# Patient Record
Sex: Male | Born: 1945 | Race: White | Hispanic: No | Smoking: Former smoker
Health system: Southern US, Community
[De-identification: ages and names within clinical notes are randomized; demographics above are authoritative.]

## PROBLEM LIST (undated history)

## (undated) DIAGNOSIS — I714 Abdominal aortic aneurysm, without rupture, unspecified: Secondary | ICD-10-CM

## (undated) DIAGNOSIS — E785 Hyperlipidemia, unspecified: Secondary | ICD-10-CM

## (undated) DIAGNOSIS — I25119 Atherosclerotic heart disease of native coronary artery with unspecified angina pectoris: Secondary | ICD-10-CM

## (undated) DIAGNOSIS — I252 Old myocardial infarction: Secondary | ICD-10-CM

## (undated) DIAGNOSIS — E119 Type 2 diabetes mellitus without complications: Secondary | ICD-10-CM

## (undated) DIAGNOSIS — I10 Essential (primary) hypertension: Secondary | ICD-10-CM

## (undated) DIAGNOSIS — M25552 Pain in left hip: Secondary | ICD-10-CM

## (undated) DIAGNOSIS — Z9289 Personal history of other medical treatment: Secondary | ICD-10-CM

## (undated) DIAGNOSIS — R079 Chest pain, unspecified: Secondary | ICD-10-CM

## (undated) HISTORY — DX: Pain in left hip: M25.552

## (undated) HISTORY — DX: Abdominal aortic aneurysm, without rupture: I71.4

## (undated) HISTORY — PX: SHOULDER SURGERY: SHX246

## (undated) HISTORY — DX: Hyperlipidemia, unspecified: E78.5

## (undated) HISTORY — DX: Type 2 diabetes mellitus without complications: E11.9

## (undated) HISTORY — DX: Chest pain, unspecified: R07.9

## (undated) HISTORY — DX: Abdominal aortic aneurysm, without rupture, unspecified: I71.40

## (undated) HISTORY — DX: Personal history of other medical treatment: Z92.89

---
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
## 1997-10-28 ENCOUNTER — Encounter: Admission: RE | Admit: 1997-10-28 | Discharge: 1998-01-26 | Payer: Self-pay | Admitting: *Deleted

## 1999-09-21 ENCOUNTER — Ambulatory Visit (HOSPITAL_COMMUNITY): Admission: RE | Admit: 1999-09-21 | Discharge: 1999-09-21 | Payer: Self-pay | Admitting: *Deleted

## 2001-06-22 ENCOUNTER — Encounter: Payer: Self-pay | Admitting: Emergency Medicine

## 2001-06-22 ENCOUNTER — Emergency Department (HOSPITAL_COMMUNITY): Admission: EM | Admit: 2001-06-22 | Discharge: 2001-06-22 | Payer: Self-pay | Admitting: Emergency Medicine

## 2002-10-27 ENCOUNTER — Ambulatory Visit (HOSPITAL_COMMUNITY): Admission: RE | Admit: 2002-10-27 | Discharge: 2002-10-27 | Payer: Self-pay | Admitting: Internal Medicine

## 2004-01-11 ENCOUNTER — Emergency Department (HOSPITAL_COMMUNITY): Admission: EM | Admit: 2004-01-11 | Discharge: 2004-01-11 | Payer: Self-pay | Admitting: Emergency Medicine

## 2005-02-06 ENCOUNTER — Ambulatory Visit (HOSPITAL_COMMUNITY): Admission: RE | Admit: 2005-02-06 | Discharge: 2005-02-06 | Payer: Self-pay | Admitting: *Deleted

## 2005-12-19 ENCOUNTER — Emergency Department (HOSPITAL_COMMUNITY): Admission: EM | Admit: 2005-12-19 | Discharge: 2005-12-19 | Payer: Self-pay | Admitting: Emergency Medicine

## 2005-12-20 ENCOUNTER — Emergency Department (HOSPITAL_COMMUNITY): Admission: EM | Admit: 2005-12-20 | Discharge: 2005-12-20 | Payer: Self-pay | Admitting: Emergency Medicine

## 2007-02-23 ENCOUNTER — Encounter: Admission: RE | Admit: 2007-02-23 | Discharge: 2007-02-23 | Payer: Self-pay | Admitting: Orthopedic Surgery

## 2010-12-15 NOTE — Op Note (Signed)
[place]  Patient:    John Shaffer, BURGETT                    MRN: [id number] Proc. Date: 09/21/99 Adm. Date:  [id number] Attending:  Sabino Gasser                           Operative Report  PROCEDURE:  Colonoscopy.  ENDOSCOPIST:  Sabino Gasser, M.D.  INDICATIONS:  Mr. Buch is a 65 year old gentleman referred by Dr. Soyla Murphy. Pharr for evaluation of Hemoccult positivity.  ANESTHESIA:  Demerol 100 mg, Versed 10 mg given intravenously in divided dose.  DESCRIPTION OF PROCEDURE:  With the patient mildly sedated in the left lateral decubitus position, the Olympus videoscopic colonoscope was inserted into the rectum after a normal rectal examination was performed, and passed under direct  vision to the cecum.  The cecum was identified by the ileocecal valve and appendiceal orifice, both of which were photographed.  From this point the colonoscope was slowly withdrawn, taking circumferential views of the entire colonic mucosa after clearing the cecum of fecal debris.  This was done until the colonoscope was removed all the way to the rectum, which appeared normal on direct view, and showed fairly large internal hemorrhoids on retroflexed view.  The endoscope was straightened and withdrawn.  The patients vital signs were pulse oximeter remained stable.  The patient tolerated the procedure well without apparent complications.  FINDINGS:  Internal hemorrhoids.  This may very well explain the patients blood  per rectum.  PLAN:  Will have the patient follow up with me on an as-needed basis, and in five years for a cancer screening. DD:  09/21/99 TD:  09/21/99 Job: [id number]

## 2012-07-18 ENCOUNTER — Other Ambulatory Visit (HOSPITAL_COMMUNITY): Payer: Self-pay | Admitting: Cardiovascular Disease

## 2012-07-18 DIAGNOSIS — R079 Chest pain, unspecified: Secondary | ICD-10-CM

## 2012-08-04 ENCOUNTER — Ambulatory Visit (HOSPITAL_COMMUNITY)
Admission: RE | Admit: 2012-08-04 | Discharge: 2012-08-04 | Disposition: A | Discharge status: Home / Self Care | Payer: 59 | Source: Ambulatory Visit | Attending: Cardiovascular Disease | Admitting: Cardiovascular Disease

## 2012-08-04 DIAGNOSIS — I059 Rheumatic mitral valve disease, unspecified: Secondary | ICD-10-CM | POA: Insufficient documentation

## 2012-08-04 DIAGNOSIS — R079 Chest pain, unspecified: Secondary | ICD-10-CM | POA: Insufficient documentation

## 2012-08-04 DIAGNOSIS — E119 Type 2 diabetes mellitus without complications: Secondary | ICD-10-CM | POA: Insufficient documentation

## 2012-08-04 DIAGNOSIS — I379 Nonrheumatic pulmonary valve disorder, unspecified: Secondary | ICD-10-CM | POA: Insufficient documentation

## 2012-08-04 NOTE — Progress Notes (Signed)
2D Echo Performed 08/04/2012    Trek Kimball, RCS  

## 2013-02-01 ENCOUNTER — Emergency Department (HOSPITAL_COMMUNITY)
Admission: EM | Admit: 2013-02-01 | Discharge: 2013-02-01 | Disposition: A | Discharge status: Home / Self Care | Payer: 59 | Attending: Emergency Medicine | Admitting: Emergency Medicine

## 2013-02-01 ENCOUNTER — Encounter (HOSPITAL_COMMUNITY): Payer: Self-pay

## 2013-02-01 DIAGNOSIS — Z79899 Other long term (current) drug therapy: Secondary | ICD-10-CM | POA: Insufficient documentation

## 2013-02-01 DIAGNOSIS — R51 Headache: Secondary | ICD-10-CM | POA: Insufficient documentation

## 2013-02-01 DIAGNOSIS — Y939 Activity, unspecified: Secondary | ICD-10-CM | POA: Insufficient documentation

## 2013-02-01 DIAGNOSIS — S90569A Insect bite (nonvenomous), unspecified ankle, initial encounter: Secondary | ICD-10-CM | POA: Insufficient documentation

## 2013-02-01 DIAGNOSIS — R21 Rash and other nonspecific skin eruption: Secondary | ICD-10-CM | POA: Insufficient documentation

## 2013-02-01 DIAGNOSIS — Y929 Unspecified place or not applicable: Secondary | ICD-10-CM | POA: Insufficient documentation

## 2013-02-01 DIAGNOSIS — Z87891 Personal history of nicotine dependence: Secondary | ICD-10-CM | POA: Insufficient documentation

## 2013-02-01 DIAGNOSIS — W57XXXA Bitten or stung by nonvenomous insect and other nonvenomous arthropods, initial encounter: Secondary | ICD-10-CM

## 2013-02-01 MED ORDER — DOXYCYCLINE HYCLATE 100 MG PO CAPS: 100.0000 mg | ORAL_CAPSULE | Freq: Two times a day (BID) | ORAL | Status: DC

## 2013-02-01 NOTE — ED Provider Notes (Signed)
History  This chart was scribed for  by Ladona Ridgel Day, ED scribe. This patient was seen in room WTR6/WTR6 and the patient's care was started at 1603.  CSN: [id number] Arrival date & time 02/01/13  1528  First MD Initiated Contact with Patient 02/01/13 1603     Chief Complaint  Patient presents with  . Insect Bite    patient removed a tick 4 days ago   Patient is a 67 y.o. male presenting with rash. The history is provided by the patient. No language interpreter was used.  Rash Pain location: LLE, lateral thigh. Pain quality: aching   Pain radiates to:  Does not radiate Pain severity:  Mild Onset quality:  Gradual Duration:  4 days Timing:  Constant Progression:  Worsening Chronicity:  New Relieved by:  Nothing Worsened by:  Nothing tried Ineffective treatments:  None tried Associated symptoms: chills   Associated symptoms: no chest pain, no cough, no fever, no nausea, no shortness of breath and no vomiting    HPI Comments: John Shaffer is a 67 y.o. male who presents to the Emergency Department complaining of tick bite removed from his left lateral thigh 4 days ago which has been constant, gradually worsening in tenderness, redness, and swelling around where the tick was removed. He states he feels like he has some flu symptoms. He denies discharge or drainage from area of his skin. He states has a moderate HA, feels achy and his left leg is sore. He states hydrating well and eating/drinking normally.   PCP Dr. Juleen China  History reviewed. No pertinent past medical history. Past Surgical History  Procedure Laterality Date  . Shoulder surgery     History reviewed. No pertinent family history. History  Substance Use Topics  . Smoking status: Former Games developer  . Smokeless tobacco: Never Used  . Alcohol Use: No    Review of Systems  Constitutional: Positive for chills. Negative for fever.  HENT: Negative for congestion and rhinorrhea.   Respiratory: Negative for cough, choking  and shortness of breath.   Cardiovascular: Negative for chest pain.  Gastrointestinal: Negative for nausea, vomiting and abdominal pain.  Musculoskeletal: Negative for back pain.  Skin: Positive for rash and wound (tick bite, swelling and redness from 4 days ago).  Neurological: Negative for syncope and weakness.  All other systems reviewed and are negative.   A complete 10 system review of systems was obtained and all systems are negative except as noted in the HPI and PMH.   Allergies  Review of patient's allergies indicates no known allergies.  Home Medications   Current Outpatient Rx  Name  Route  Sig  Dispense  Refill  . Multiple Vitamin (MULTIVITAMIN WITH MINERALS) TABS   Oral   Take 1 tablet by mouth daily.         . rosuvastatin (CRESTOR) 20 MG tablet   Oral   Take 20 mg by mouth at bedtime.          Triage Vitals: BP 137/74  Pulse 96  Temp(Src) 99.1 F (37.3 C) (Oral)  Resp 16  Ht 5' 10.5" (1.791 m)  Wt 202 lb 2 oz (91.683 kg)  BMI 28.58 kg/m2  SpO2 97% Physical Exam  Nursing note and vitals reviewed. Constitutional: He is oriented to person, place, and time. He appears well-developed and well-nourished. No distress.  HENT:  Head: Normocephalic and atraumatic.  Eyes: EOM are normal.  Neck: Neck supple. No tracheal deviation present.  Cardiovascular: Normal rate.   Pulmonary/Chest:  Effort normal. No respiratory distress.  Musculoskeletal: Normal range of motion.  Neurological: He is alert and oriented to person, place, and time.  Skin: Skin is warm and dry.     Psychiatric: He has a normal mood and affect. His behavior is normal.    ED Course  Procedures (including critical care time) DIAGNOSTIC STUDIES: Oxygen Saturation is 97% on room air, normal by my interpretation.    COORDINATION OF CARE: At 410 PM Discussed treatment plan with patient which includes doxycycline for 21 days. Will see PCP Dr. Juleen China on the 18th of this this month. Will try to  call and see if he can get in sooner. Patient agrees.   Labs Reviewed - No data to display Dx: Tick Bite  MDM   66 y.o.John Shaffer's evaluation in the Emergency Department is complete. It has been determined that no acute conditions requiring further emergency intervention are present at this time. The patient/guardian have been advised of the diagnosis and plan. We have discussed signs and symptoms that warrant return to the ED, such as changes or worsening in symptoms.  Vital signs are stable at discharge. Filed Vitals:   02/01/13 1544  BP: 137/74  Pulse: 96  Temp: 99.1 F (37.3 C)  Resp: 16    Patient/guardian has voiced understanding and agreed to follow-up with the PCP or specialist.  I personally performed the services described in this documentation, which was scribed in my presence. The recorded information has been reviewed and is accurate.    Dorthula Matas, PA-C 02/01/13 1623

## 2013-02-01 NOTE — ED Provider Notes (Signed)
Medical screening examination/treatment/procedure(s) were performed by non-physician practitioner and as supervising physician I was immediately available for consultation/collaboration.  Ethelda Chick, MD 02/01/13 1630

## 2013-02-01 NOTE — ED Notes (Signed)
Patient states that he removed a tick from left outer thigh. Area is red and sore. Patient reports tht he feels like he has the flu.

## 2013-02-01 NOTE — ED Notes (Signed)
Lab tech at bedside for blood draw.

## 2013-02-01 NOTE — ED Notes (Signed)
Pt ambulatory to exam room with steady gait. Pt states he has a headache and had a fever. Pt a/o x 4 with no acute distress.

## 2013-02-02 LAB — B. BURGDORFI ANTIBODIES: B burgdorferi Ab IgG+IgM: 0.35 {ISR}

## 2015-02-15 ENCOUNTER — Encounter: Payer: Self-pay | Admitting: *Deleted

## 2015-03-28 ENCOUNTER — Encounter: Payer: Self-pay | Admitting: Cardiovascular Disease

## 2015-03-30 ENCOUNTER — Encounter: Payer: Self-pay | Admitting: Cardiovascular Disease

## 2017-01-27 ENCOUNTER — Encounter (HOSPITAL_COMMUNITY): Payer: Self-pay | Admitting: Emergency Medicine

## 2017-01-27 ENCOUNTER — Emergency Department (HOSPITAL_COMMUNITY)
Admission: EM | Admit: 2017-01-27 | Discharge: 2017-01-27 | Disposition: A | Discharge status: Home / Self Care | Payer: Medicare Other | Attending: Emergency Medicine | Admitting: Emergency Medicine

## 2017-01-27 DIAGNOSIS — T782XXA Anaphylactic shock, unspecified, initial encounter: Secondary | ICD-10-CM | POA: Diagnosis not present

## 2017-01-27 DIAGNOSIS — Y999 Unspecified external cause status: Secondary | ICD-10-CM | POA: Diagnosis not present

## 2017-01-27 DIAGNOSIS — T63461A Toxic effect of venom of wasps, accidental (unintentional), initial encounter: Secondary | ICD-10-CM | POA: Diagnosis not present

## 2017-01-27 DIAGNOSIS — Y929 Unspecified place or not applicable: Secondary | ICD-10-CM | POA: Diagnosis not present

## 2017-01-27 DIAGNOSIS — R600 Localized edema: Secondary | ICD-10-CM | POA: Insufficient documentation

## 2017-01-27 DIAGNOSIS — Z87891 Personal history of nicotine dependence: Secondary | ICD-10-CM | POA: Diagnosis not present

## 2017-01-27 DIAGNOSIS — R0602 Shortness of breath: Secondary | ICD-10-CM | POA: Diagnosis present

## 2017-01-27 DIAGNOSIS — Y9389 Activity, other specified: Secondary | ICD-10-CM | POA: Insufficient documentation

## 2017-01-27 DIAGNOSIS — Z79899 Other long term (current) drug therapy: Secondary | ICD-10-CM | POA: Diagnosis not present

## 2017-01-27 DIAGNOSIS — W57XXXA Bitten or stung by nonvenomous insect and other nonvenomous arthropods, initial encounter: Secondary | ICD-10-CM | POA: Insufficient documentation

## 2017-01-27 MED ORDER — DIPHENHYDRAMINE HCL 25 MG PO TABS: 25.0000 mg | ORAL_TABLET | Freq: Three times a day (TID) | ORAL | 0 refills | Status: DC

## 2017-01-27 MED ORDER — METHYLPREDNISOLONE SODIUM SUCC 125 MG IJ SOLR
125.0000 mg | Freq: Once | INTRAMUSCULAR | Status: AC
  Administered 2017-01-27: 125 mg via INTRAVENOUS
  Filled 2017-01-27: qty 2

## 2017-01-27 MED ORDER — FAMOTIDINE 20 MG PO TABS: 20.0000 mg | ORAL_TABLET | Freq: Two times a day (BID) | ORAL | 0 refills | Status: DC

## 2017-01-27 MED ORDER — EPINEPHRINE 0.3 MG/0.3ML IJ SOAJ: 0.3000 mg | Freq: Once | INTRAMUSCULAR | 1 refills | Status: AC

## 2017-01-27 MED ORDER — FAMOTIDINE IN NACL 20-0.9 MG/50ML-% IV SOLN
20.0000 mg | Freq: Once | INTRAVENOUS | Status: AC
  Administered 2017-01-27: 20 mg via INTRAVENOUS
  Filled 2017-01-27: qty 50

## 2017-01-27 MED ORDER — EPINEPHRINE 0.3 MG/0.3ML IJ SOAJ
INTRAMUSCULAR | Status: AC
  Administered 2017-01-27: 13:00:00
  Filled 2017-01-27: qty 0.3

## 2017-01-27 NOTE — ED Notes (Addendum)
Pt reports he is feeling much better,  No sob and hives have improved after steroids and pepcid. Pt maintaining O2 sat WNL 97-99% after trialed off of O2

## 2017-01-27 NOTE — ED Triage Notes (Signed)
Pt reports getting stung by a bee approx 45 mins ago while reaching into a tool box, states that he felt sob and broke out in hives. Took 2 benadryl pta. Pt a/ox4 upon arrival, pale and diffuse hives over torso and limbs. No hx of allergies to bees per pt

## 2017-01-27 NOTE — ED Notes (Signed)
Pt placed on 4L O2 via [place].

## 2017-01-27 NOTE — ED Provider Notes (Signed)
[facility]-EMERGENCY DEPT Provider Note   CSN: [id number] Arrival date & time: 01/27/17  1253     History   Chief Complaint Chief Complaint  Patient presents with  . Allergic Reaction    HPI John Shaffer is a 71 y.o. male.  HPI  Patient presents immediately after developing shortness of breath, swelling, generalized discomfort and substantial cutaneous lesions.  These changes occurred after the patient was bitten by a wasp. Patient was bitten in the right upper extremity.  He denies a history of allergic reaction to wasp or bees, but does have prior allergic reactions. Patient took Benadryl after the event, and received epinephrine immediately after arrival here,he feels somewhat better during my initial evaluation.  Prior to the event the patient was in his usual state of health.   Past Medical History:  Diagnosis Date  . Chest pain   . H/O exercise stress test    chets pain, atypical  . HLD (hyperlipidemia)     There are no active problems to display for this patient.   Past Surgical History:  Procedure Laterality Date  . SHOULDER SURGERY         Home Medications    Prior to Admission medications   Medication Sig Start Date End Date Taking? Authorizing Provider  doxycycline (VIBRAMYCIN) 100 MG capsule Take 1 capsule (100 mg total) by mouth 2 (two) times daily. 02/01/13   Marlon Pel, PA-C  Multiple Vitamin (MULTIVITAMIN WITH MINERALS) TABS Take 1 tablet by mouth daily.    [provider]  rosuvastatin (CRESTOR) 20 MG tablet Take 20 mg by mouth at bedtime.    [provider]    Family History Family History  Problem Relation Age of Onset  . Lung disease Mother   . Heart failure Father   . Cancer Maternal Grandmother   . Lung disease Maternal Grandfather   . Cancer Paternal Grandfather     Social History Social History  Substance Use Topics  . Smoking status: Former Games developer  . Smokeless tobacco: Never Used  . Alcohol use No      Allergies   Bee venom   Review of Systems Review of Systems  Constitutional:       Per HPI, otherwise negative  HENT:       Per HPI, otherwise negative  Respiratory:       Per HPI, otherwise negative  Cardiovascular:       Per HPI, otherwise negative  Gastrointestinal: Negative for vomiting.  Endocrine:       Negative aside from HPI  Genitourinary:       Neg aside from HPI   Musculoskeletal:       Per HPI, otherwise negative  Skin: Positive for color change and rash.  Allergic/Immunologic: Negative for immunocompromised state.  Neurological: Negative for syncope.     Physical Exam Updated Vital Signs BP 95/63   Pulse 71   Temp 97.5 F (36.4 C) (Oral)   Resp 14   Ht 5' 10.5" (1.791 m)   Wt 91.2 kg (201 lb)   SpO2 (!) 89%   BMI 28.43 kg/m   Physical Exam  Constitutional: He is oriented to person, place, and time. He appears well-developed.  Anxious male sitting upright speaking clearly  HENT:  Head: Normocephalic and atraumatic.  Mouth/Throat: Uvula is midline, oropharynx is clear and moist and mucous membranes are normal.  Eyes: Conjunctivae and EOM are normal.  Cardiovascular: Normal rate and regular rhythm.   Pulmonary/Chest: Effort normal. No stridor. No  respiratory distress.  Abdominal: He exhibits no distension.  Musculoskeletal: He exhibits no edema.  Neurological: He is alert and oriented to person, place, and time.  Skin: Skin is warm and dry.  Diffuse urticarial patches both axilla, both feet, chest, abdomen  Psychiatric: He has a normal mood and affect.  Nursing note and vitals reviewed.    ED Treatments / Results   Procedures Procedures (including critical care time)  Medications Ordered in ED Medications  famotidine (PEPCID) IVPB 20 mg premix (20 mg Intravenous New Bag/Given 01/27/17 1316)  EPINEPHrine (EPI-PEN) 0.3 mg/0.3 mL injection (  Given 01/27/17 1302)  methylPREDNISolone sodium succinate (SOLU-MEDROL) 125 mg/2 mL injection 125  mg (125 mg Intravenous Given 01/27/17 1315)   Initial vital signs notable for hypoxia, pulse oxygenation 88% on room air, this is abnormal. Patient received nasal cannula with improvement.  Initial Impression / Assessment and Plan / ED Course  I have reviewed the triage vital signs and the nursing notes.  Pertinent labs & imaging results that were available during my care of the patient were reviewed by me and considered in my medical decision making (see chart for details).  Update: After the initial evaluation the patient has received epinephrine, Solu-Medrol, Benadryl, Pepcid he is starting to feel better.  3:49 PM Hypoxia has resolved blood pressure is normal, rash has resolved. We discussed the patient's presentation, concern for anaphylaxis given the cutaneous findings, hypoxia, and the importance of close outpatient follow-up. As the patient has no prior history of allergic reactions with wasp sting, he will also follow-up with primary care, be discharged with epinephrine autoinjector.   CRITICAL CARE Performed by: Gerhard MunchLOCKWOOD, Aiya Keach Total critical care time: 40 minutes Critical care time was exclusive of separately billable procedures and treating other patients. Critical care was necessary to treat or prevent imminent or life-threatening deterioration. Critical care was time spent personally by me on the following activities: development of treatment plan with patient and/or surrogate as well as nursing, discussions with consultants, evaluation of patient's response to treatment, examination of patient, obtaining history from patient or surrogate, ordering and performing treatments and interventions, ordering and review of laboratory studies, ordering and review of radiographic studies, pulse oximetry and re-evaluation of patient's condition.  Final Clinical Impressions(s) / ED Diagnoses  Anaphylaxis   Gerhard MunchLockwood, Agness Sibrian, MD 01/27/17 1550

## 2017-01-27 NOTE — ED Notes (Signed)
ED Provider at bedside. 

## 2018-02-12 ENCOUNTER — Telehealth: Payer: Self-pay | Admitting: Hematology

## 2018-02-12 NOTE — Telephone Encounter (Signed)
Dr. Mosetta PuttFeng. [facility] [place] [facility] called today for status of patient referral and will need appointment date/time, provider full name and NPI # once appointment is scheduled. Spoke with MM re dx and which provider can see patient. Referral in review with Dr. Mosetta PuttFeng. Chelsea to be contacted with info at [phone] (secure vm)   Also spoke with Stanton KidneyDebra at [facility] [phone]) to confirm what facility patient should be seen at. Per Stanton Kidneyebra [facility] at [address] [facility] [address]

## 2018-02-19 NOTE — Telephone Encounter (Signed)
After chart review and speaking with both Dr. Arbutus PedMohamed and Dr. Mosetta PuttFeng patient should be referred to GI or infection disease verses oncology. Should patient have a malignancy [facility] will be happy to see him.   Spoke with Tiffany at [facility] (referring facility [phone]) re above and also left message for [place] at the [facility] [phone]).

## 2018-07-09 ENCOUNTER — Other Ambulatory Visit: Payer: Self-pay

## 2018-07-09 NOTE — Patient Outreach (Signed)
[facility] Care Management  07/09/2018  Rock NephewDavid P Stfleur 01/12/1946 [id number]   Medication Adherence call to Mr. Wyline MoodDavid Egnor left a message for patient to call back patient is due on Rosuvastatin 40 mg. Mr. Storm FriskValavanis is showing past due under [employer].   Lillia AbedAna Ollison-Moran CPhT Pharmacy Technician [facility] Care Management Direct Dial [phone]  Fax [phone] [email or web address]

## 2018-08-26 ENCOUNTER — Other Ambulatory Visit: Payer: Self-pay | Admitting: General Surgery

## 2018-08-26 DIAGNOSIS — N289 Disorder of kidney and ureter, unspecified: Secondary | ICD-10-CM

## 2018-08-31 ENCOUNTER — Ambulatory Visit
Admission: RE | Admit: 2018-08-31 | Discharge: 2018-08-31 | Disposition: A | Discharge status: Home / Self Care | Payer: Medicare Other | Source: Ambulatory Visit | Attending: General Surgery | Admitting: General Surgery

## 2018-08-31 DIAGNOSIS — N289 Disorder of kidney and ureter, unspecified: Secondary | ICD-10-CM

## 2018-08-31 MED ORDER — GADOBENATE DIMEGLUMINE 529 MG/ML IV SOLN
19.0000 mL | Freq: Once | INTRAVENOUS | Status: AC | PRN
  Administered 2018-08-31: 19 mL via INTRAVENOUS

## 2018-09-02 NOTE — Progress Notes (Signed)
Please let patient know CT looks unchanged from 2 years ago.

## 2018-09-08 ENCOUNTER — Other Ambulatory Visit: Payer: Self-pay | Admitting: Cardiology

## 2018-09-08 DIAGNOSIS — I25118 Atherosclerotic heart disease of native coronary artery with other forms of angina pectoris: Secondary | ICD-10-CM

## 2018-10-13 ENCOUNTER — Other Ambulatory Visit: Payer: Self-pay

## 2018-10-13 NOTE — Patient Outreach (Signed)
[facility]) Care Management  10/13/2018  John Shaffer 08/19/1945 [id number]   Medication Adherence call to John Shaffer left a message for patient to call back patient is due on Metformin ER 750 mg. [facility] said patient has not pick up seen 07/2018. John Shaffer is showing past due under [employer].   John Shaffer CPhT Pharmacy Technician [facility] Care Management Direct Dial [phone]  Fax [phone] [email or web address]

## 2018-11-03 ENCOUNTER — Other Ambulatory Visit: Payer: Self-pay

## 2018-11-03 NOTE — Patient Outreach (Signed)
[facility]) Care Management  11/03/2018  TYLYN BLAUSTEIN April 21, 1946 [id number]   Medication Adherence call to Mr. Jasir Urbanowicz [employer] spoke with patient he is due on Metformin Er 750 mg patient explain he is only taking 1 tablet daily instead of 2 tablets daily patient prefers to do it this way, he said doctor never told him to take 1 tablet daily but patient feels that it works for him. Mr. Laino is showing past due under [employer].   Lillia Abed CPhT Pharmacy Technician [facility] Care Management Direct Dial [phone]  Fax [phone] [email or web address]

## 2019-01-23 ENCOUNTER — Ambulatory Visit (INDEPENDENT_AMBULATORY_CARE_PROVIDER_SITE_OTHER): Payer: Medicare Other | Admitting: Cardiology

## 2019-01-23 ENCOUNTER — Other Ambulatory Visit: Payer: Self-pay

## 2019-01-23 ENCOUNTER — Encounter: Payer: Self-pay | Admitting: Cardiology

## 2019-01-23 VITALS — BP 133/79 | HR 62 | Ht 70.0 in | Wt 207.0 lb

## 2019-01-23 DIAGNOSIS — I10 Essential (primary) hypertension: Secondary | ICD-10-CM

## 2019-01-23 DIAGNOSIS — R0602 Shortness of breath: Secondary | ICD-10-CM | POA: Diagnosis not present

## 2019-01-23 DIAGNOSIS — I252 Old myocardial infarction: Secondary | ICD-10-CM | POA: Insufficient documentation

## 2019-01-23 DIAGNOSIS — E119 Type 2 diabetes mellitus without complications: Secondary | ICD-10-CM

## 2019-01-23 DIAGNOSIS — I2510 Atherosclerotic heart disease of native coronary artery without angina pectoris: Secondary | ICD-10-CM | POA: Diagnosis not present

## 2019-01-23 DIAGNOSIS — E785 Hyperlipidemia, unspecified: Secondary | ICD-10-CM

## 2019-01-23 DIAGNOSIS — I25119 Atherosclerotic heart disease of native coronary artery with unspecified angina pectoris: Secondary | ICD-10-CM

## 2019-01-23 DIAGNOSIS — R140 Abdominal distension (gaseous): Secondary | ICD-10-CM

## 2019-01-23 DIAGNOSIS — E7800 Pure hypercholesterolemia, unspecified: Secondary | ICD-10-CM

## 2019-01-23 HISTORY — DX: Old myocardial infarction: I25.2

## 2019-01-23 HISTORY — DX: Hyperlipidemia, unspecified: E78.5

## 2019-01-23 HISTORY — DX: Atherosclerotic heart disease of native coronary artery with unspecified angina pectoris: I25.119

## 2019-01-23 HISTORY — DX: Essential (primary) hypertension: I10

## 2019-01-23 NOTE — Patient Instructions (Addendum)
Stop Crestor for 2 weeks to see if symptoms improve

## 2019-01-23 NOTE — Progress Notes (Signed)
Primary Physician:  Jani Gravel, MD   Patient ID: John Shaffer, male    DOB: Mar 18, 1946, 73 y.o.   MRN: [id number]  Subjective:    Chief Complaint  Patient presents with  . Coronary Artery Disease  . Hypertension  . Follow-up    HPI: John Shaffer  is a 73 y.o. male  with CAD, hyperlipidemia, OSA on CPAP, and diabetes. He was in [place] on 01/17/2016, had NSTEMI and underwent coronary angiogram with stenting of the proximal RCA with Integrity 3.5 x 39m BMS.   He was was found to have multiple lymph nodes in his groin and also in the abdomen and has had multiple lymphnodes but has remained stable over the past 2 years and now follwos Dr. BNicholas Losefrom surgical standpoint,  On his last office visit 6 months ago, due to fatigue and dyspnea, he underwent nuclear stress test and also I changed his carvedilol to metoprolol which is tolerating. He had previously tried statin holiday, but is unsure if this helped his symptoms or not. No chest pain. He still continues to have episodes of fatigue and shortness of breath. Feels as though he cannot take a good cleansing breath.  He is on Metformin for diabetes and since being on the medication, he has had abdominal bloating.     Past Medical History:  Diagnosis Date  . Atherosclerosis of native coronary artery of native heart with angina pectoris ([place] 01/23/2019  . Chest pain   . H/O exercise stress test    chets pain, atypical  . H/O non-ST elevation myocardial infarction (NSTEMI) 01/23/2019  . HLD (hyperlipidemia)   . HTN (hypertension) 01/23/2019  . Hyperlipemia 01/23/2019    Past Surgical History:  Procedure Laterality Date  . SHOULDER SURGERY      Social History   Socioeconomic History  . Marital status: Divorced    Spouse name: Not on file  . Number of children: 1  . Years of education: Not on file  . Highest education level: Not on file  Occupational History  . Not on file  Social Needs  . Financial resource  strain: Not on file  . Food insecurity    Worry: Not on file    Inability: Not on file  . Transportation needs    Medical: Not on file    Non-medical: Not on file  Tobacco Use  . Smoking status: Former Smoker    Packs/day: 0.50    Types: Cigarettes    Quit date: 1989    Years since quitting: 31.5  . Smokeless tobacco: Never Used  . Tobacco comment: started smoking in 7th grade   Substance and Sexual Activity  . Alcohol use: Yes    Comment: rarely   . Drug use: No  . Sexual activity: Not on file  Lifestyle  . Physical activity    Days per week: Not on file    Minutes per session: Not on file  . Stress: Not on file  Relationships  . Social [place] phone: Not on file    Gets together: Not on file    Attends religious service: Not on file    Active member of club or organization: Not on file    Attends meetings of clubs or organizations: Not on file    Relationship status: Not on file  . Intimate partner violence    Fear of current or ex partner: Not on file    Emotionally abused: Not on file  Physically abused: Not on file    Forced sexual activity: Not on file  Other Topics Concern  . Not on file  Social History Narrative  . Not on file    Review of Systems  Constitution: Negative for decreased appetite, malaise/fatigue, weight gain and weight loss.  Eyes: Negative for visual disturbance.  Cardiovascular: Positive for dyspnea on exertion. Negative for chest pain, claudication, leg swelling, orthopnea, palpitations and syncope.  Respiratory: Negative for hemoptysis and wheezing.   Endocrine: Negative for cold intolerance and heat intolerance.  Hematologic/Lymphatic: Does not bruise/bleed easily.  Skin: Negative for nail changes.  Musculoskeletal: Negative for muscle weakness and myalgias.  Gastrointestinal: Positive for bloating. Negative for abdominal pain, change in bowel habit, nausea and vomiting.  Neurological: Positive for dizziness. Negative  for difficulty with concentration, focal weakness and headaches.  Psychiatric/Behavioral: Negative for altered mental status and suicidal ideas.  All other systems reviewed and are negative.     Objective:  Blood pressure 133/79, pulse 62, height '5\' 10"'$  (1.778 m), weight 207 lb (93.9 kg), SpO2 96 %. Body mass index is 29.7 kg/m.    Physical Exam  Constitutional: He is oriented to person, place, and time. Vital signs are normal. He appears well-developed and well-nourished.  HENT:  Head: Normocephalic and atraumatic.  Neck: Normal range of motion.  Cardiovascular: Normal rate, regular rhythm, normal heart sounds and intact distal pulses.  Pulmonary/Chest: Effort normal and breath sounds normal. No accessory muscle usage. No respiratory distress.  Abdominal: Soft. Bowel sounds are normal.  Musculoskeletal: Normal range of motion.  Neurological: He is alert and oriented to person, place, and time.  Skin: Skin is warm and dry.  Vitals reviewed.  Radiology: No results found.  Laboratory examination:   05/22/2018: CBC normal. Glucose 124, alkaline phos 35, creatinine 0.8, EGFR 90, potassium 4.3, CMP otherwise normal. Hemoglobin A1c 6.7%. Cholesterol 135, triglycerides 69, HDL 44, LDL 77. TSH 3.9. No flowsheet data found. No flowsheet data found. Lipid Panel  No results found for: CHOL, TRIG, HDL, CHOLHDL, VLDL, LDLCALC, LDLDIRECT HEMOGLOBIN A1C No results found for: HGBA1C, MPG TSH No results for input(s): TSH in the last 8760 hours.  PRN Meds:. Medications Discontinued During This Encounter  Medication Reason  . clopidogrel (PLAVIX) 75 MG tablet Error  . doxycycline (VIBRAMYCIN) 100 MG capsule Error  . ONGLYZA 5 MG TABS tablet Error   Current Meds  Medication Sig  . aspirin EC 81 MG tablet Take 81 mg by mouth daily.  . diphenhydrAMINE (BENADRYL) 25 MG tablet Take 1 tablet (25 mg total) by mouth 3 (three) times daily. Take one tablet three times daily for two days (Patient  taking differently: Take 25 mg by mouth as needed. Take one tablet three times daily for two days)  . famotidine (PEPCID) 20 MG tablet Take 1 tablet (20 mg total) by mouth 2 (two) times daily. Take one tablet twice daily for two days  . losartan (COZAAR) 25 MG tablet Take 25 mg by mouth daily.  . metFORMIN (GLUCOPHAGE-XR) 750 MG 24 hr tablet Take 750 mg by mouth daily with breakfast.  . metoprolol succinate (TOPROL-XL) 50 MG 24 hr tablet Take 1 tablet (50 mg total) by mouth daily.  . Multiple Vitamin (MULTIVITAMIN WITH MINERALS) TABS Take 1 tablet by mouth. occasionally  . rosuvastatin (CRESTOR) 20 MG tablet Take 20 mg by mouth at bedtime.    Cardiac Studies:   Exercise myoview stress 07/18/2018: 1. The patient performed treadmill exercise using Bruce protocol, completing 7:17  minutes. The patient completed an estimated workload of 9 METS, reaching 104% of the maximum predicted heart rate. Normal hemodynamic response seen. No stress symptoms reported. Exercise capacity was low normal. The stress electrocardiogram showed sinus tachycardia, normal stress conduction, occasional PAC and PVC, and normal stress repolarization. No ischemic changes seen on stress electrocardiogram. 2. The overall quality of the study is fair. There is no evidence of abnormal lung activity. Gated SPECT imaging reveals normal myocardial thickening and wall motion. The left ventricular ejection fraction was normal (54%). Stress and rest SPECT images demonstrate small sized, mild intensity, fixed perfusion defect in inferior myocardium. Given normal wall motion, this likely represents diaphragmatic attenuation artifact. 3. Low risk study.  Coronary angiogram 01/20/2016: Normal LVEF, 65%. Proximal RCA S/P 3.5 x 26 mm integrity bare-metal stent implantation. Distal RCA 50% at bifurcation, PDA 80% (small) mid LAD 40% stenosis.  Echocardiogram 07/09/2018: Left ventricle cavity is normal in size. Normal global wall motion.  Calculated EF 55%. Left atrial cavity is mildly dilated. Mild (Grade I) mitral regurgitation. Mild tricuspid regurgitation. Estimated pulmonary artery systolic pressure 70-96 mmHg. IVC is dilated with blunted respiratory response. Estimated RA pressure 10-15 mmHg. Assessment:     ICD-10-CM   1. Atherosclerosis of native coronary artery of native heart without angina pectoris  I25.10 EKG 12-Lead  2. Shortness of breath  R06.02   3. Abdominal bloating  R14.0   4. Essential hypertension  I10   5. Controlled type 2 diabetes mellitus without complication, without long-term current use of insulin (HCC)  E11.9   6. Pure hypercholesterolemia  E78.00     EKG 01/23/2019: Sinus bradycardia at 57 bpm, normal axis, no evidence of ischemia.   Recommendations:   Patient is here for 6 month follow up. He continues to complain of fatigue and shortness of breath, that he describes as inability to take good cleansing breath. Previously he was recommended to try statin holiday, but is unsure if he had improvement in his symptoms with this. He has had reassuring cardiac workup in 2019 with low risk nuclear stress test and echocardiogram that revealed normal LVEF, was noted to have slightly elevated PA pressure. By previous OV note, it appears that he had improvement in symptoms with holding the Crestor. I have asked him to again perform statin holiday for 2 weeks and see if he notices any improvement. He will notify me on his symptoms in 2 weeks and will decide on changing to Lipitor depending upon his response. No chest pain, do not feel that cardiac etiology; however, in view of disease in RCA by previous cath, may consider further cardiac evaluation if no improvement.  Also question if his symptoms may also be contributed by abdominal bloating from Metformin as his symptoms started sometime around the time he started the medication. I do not have recent labs from PCP office, will request for evaluation. I do  feel that he would benefit from [facility] for CV benefits and have provided him with 10 mg samples today, but I have asked him to not start until I notify him okay to start as I would first like to have evaluated his labs and also would like opinion for his endocrinologist before starting. Will reach out to their office.  No changes are noted to EKG or physical exam. Blood pressure is well controlled. Will see him back in 3 months, but encouraged him to contact me sooner if needed.    Miquel Dunn, MSN, APRN, FNP-C [facility] [place] Office:  [phone] Fax: [phone]

## 2019-01-28 ENCOUNTER — Other Ambulatory Visit: Payer: Self-pay

## 2019-01-28 MED ORDER — JARDIANCE 10 MG PO TABS: 10.0000 mg | ORAL_TABLET | Freq: Every day | ORAL | 0 refills | Status: DC

## 2019-01-28 NOTE — Progress Notes (Signed)
Pt says he feels a little better

## 2019-01-28 NOTE — Progress Notes (Signed)
Please let patient know that his endocrinologist is okay with him starting Jardiance. Recent labs look good. How are his symptoms with holding the Crestor?

## 2019-01-28 NOTE — Progress Notes (Signed)
PCP labs 01/01/2019: Glucose 139, creatinine 1.12, EGFR 66, potassium 4.7, CMP otherwise normal.  TSH normal.  11/20/2018: Hemoglobin A1c 6.8%.  Cholesterol 109, triglycerides 146, HDL 27, LDL 53.  CBC normal.

## 2019-02-02 NOTE — Progress Notes (Signed)
Lvm for pt

## 2019-02-12 ENCOUNTER — Telehealth: Payer: Self-pay

## 2019-02-12 NOTE — Telephone Encounter (Signed)
Pt called and left me a voice mail he wants you to call him about stopping a medication and changing to something else, Please call pt @ [phone]

## 2019-02-12 NOTE — Telephone Encounter (Signed)
Returned call to patient. He has had some improvement in shortness of breath and fatigue since switching to [place] and being off Crestor. He has also made diet changes. He is complaining of having to go to restroom at night due to this. I have advised for him to take first thing in the morning. He prefers to try to go back on Crestor and see how he does, which i am agreeable to. If he has worsening symptoms will change at that time. He will also continue with diet changes.

## 2019-02-13 ENCOUNTER — Ambulatory Visit: Payer: Self-pay | Admitting: Cardiology

## 2019-02-26 ENCOUNTER — Other Ambulatory Visit: Payer: Self-pay | Admitting: Cardiology

## 2019-04-27 ENCOUNTER — Ambulatory Visit (INDEPENDENT_AMBULATORY_CARE_PROVIDER_SITE_OTHER): Payer: Medicare Other | Admitting: Cardiology

## 2019-04-27 ENCOUNTER — Encounter: Payer: Self-pay | Admitting: Cardiology

## 2019-04-27 ENCOUNTER — Other Ambulatory Visit: Payer: Self-pay

## 2019-04-27 VITALS — BP 117/70 | HR 59 | Temp 96.8°F | Ht 70.5 in | Wt 205.8 lb

## 2019-04-27 DIAGNOSIS — E7800 Pure hypercholesterolemia, unspecified: Secondary | ICD-10-CM

## 2019-04-27 DIAGNOSIS — I2510 Atherosclerotic heart disease of native coronary artery without angina pectoris: Secondary | ICD-10-CM | POA: Diagnosis not present

## 2019-04-27 DIAGNOSIS — I10 Essential (primary) hypertension: Secondary | ICD-10-CM

## 2019-04-27 DIAGNOSIS — M25552 Pain in left hip: Secondary | ICD-10-CM | POA: Insufficient documentation

## 2019-04-27 DIAGNOSIS — R0602 Shortness of breath: Secondary | ICD-10-CM | POA: Diagnosis not present

## 2019-04-27 NOTE — Progress Notes (Signed)
Primary Physician:  Jani Gravel, MD   Patient ID: John Shaffer, male    DOB: June 12, 1946, 73 y.o.   MRN: [id number]  Subjective:    Chief Complaint  Patient presents with  . Follow-up    3 month  . Shortness of Breath  . Fatigue    HPI: John Shaffer  is a 73 y.o. male  with CAD, hyperlipidemia, OSA on CPAP, and diabetes. He was in [place] on 01/17/2016, had NSTEMI and underwent coronary angiogram with stenting of the proximal RCA with Integrity 3.5 x 65m BMS.   He was was found to have multiple lymph nodes in his groin and also in the abdomen and has had multiple lymphnodes but has remained stable over the past 2 years and now follwos Dr. BNicholas Losefrom surgical standpoint,  On his last office visit 3 months ago, he continued to have dyspnea and fatigue. He underwent stress testing in Dec 2019 that was considered low risk study. Symptoms felt to be potentially related to statin therapy; however, did not notice any improvement with statin holiday, and is now back on Crestor. He does report for the last few months, his symptoms are better.  He is able to walk 1.5-2 miles daily. No chest pain.   He was started on Jardiance 10 mg at his last office visit for CV protection and also diabetes management. Diabetes continues to be uncontrolled.       Past Medical History:  Diagnosis Date  . Atherosclerosis of native coronary artery of native heart with angina pectoris [place] 01/23/2019  . Chest pain   . H/O exercise stress test    chets pain, atypical  . H/O non-ST elevation myocardial infarction (NSTEMI) 01/23/2019  . Hip pain, left   . HLD (hyperlipidemia)   . HTN (hypertension) 01/23/2019  . Hyperlipemia 01/23/2019    Past Surgical History:  Procedure Laterality Date  . SHOULDER SURGERY      Social History   Socioeconomic History  . Marital status: Divorced    Spouse name: Not on file  . Number of children: 1  . Years of education: Not on file  . Highest education  level: Not on file  Occupational History  . Not on file  Social Needs  . Financial resource strain: Not on file  . Food insecurity    Worry: Not on file    Inability: Not on file  . Transportation needs    Medical: Not on file    Non-medical: Not on file  Tobacco Use  . Smoking status: Former Smoker    Packs/day: 0.50    Types: Cigarettes    Quit date: 1989    Years since quitting: 31.7  . Smokeless tobacco: Never Used  . Tobacco comment: started smoking in 7th grade   Substance and Sexual Activity  . Alcohol use: Yes    Comment: rarely   . Drug use: No  . Sexual activity: Not on file  Lifestyle  . Physical activity    Days per week: Not on file    Minutes per session: Not on file  . Stress: Not on file  Relationships  . Social [place] phone: Not on file    Gets together: Not on file    Attends religious service: Not on file    Active member of club or organization: Not on file    Attends meetings of clubs or organizations: Not on file    Relationship status: Not  on file  . Intimate partner violence    Fear of current or ex partner: Not on file    Emotionally abused: Not on file    Physically abused: Not on file    Forced sexual activity: Not on file  Other Topics Concern  . Not on file  Social History Narrative  . Not on file    Review of Systems  Constitution: Negative for decreased appetite, malaise/fatigue, weight gain and weight loss.  Eyes: Negative for visual disturbance.  Cardiovascular: Positive for dyspnea on exertion (improved). Negative for chest pain, claudication, leg swelling, orthopnea, palpitations and syncope.  Respiratory: Negative for hemoptysis and wheezing.   Endocrine: Negative for cold intolerance and heat intolerance.  Hematologic/Lymphatic: Does not bruise/bleed easily.  Skin: Negative for nail changes.  Musculoskeletal: Negative for muscle weakness and myalgias.  Gastrointestinal: Positive for bloating (with eating  certain foods). Negative for abdominal pain, change in bowel habit, nausea and vomiting.  Neurological: Positive for dizziness. Negative for difficulty with concentration, focal weakness and headaches.  Psychiatric/Behavioral: Negative for altered mental status and suicidal ideas.  All other systems reviewed and are negative.     Objective:  Blood pressure 117/70, pulse (!) 59, temperature (!) 96.8 F (36 C), height 5' 10.5" (1.791 m), weight 205 lb 12.8 oz (93.4 kg), SpO2 100 %. Body mass index is 29.11 kg/m.    Physical Exam  Constitutional: He is oriented to person, place, and time. Vital signs are normal. He appears well-developed and well-nourished.  HENT:  Head: Normocephalic and atraumatic.  Neck: Normal range of motion.  Cardiovascular: Normal rate, regular rhythm, normal heart sounds and intact distal pulses.  Pulmonary/Chest: Effort normal and breath sounds normal. No accessory muscle usage. No respiratory distress.  Abdominal: Soft. Bowel sounds are normal.  Musculoskeletal: Normal range of motion.  Neurological: He is alert and oriented to person, place, and time.  Skin: Skin is warm and dry.  Vitals reviewed.  Radiology: No results found.  Laboratory examination:   05/22/2018: CBC normal. Glucose 124, alkaline phos 35, creatinine 0.8, EGFR 90, potassium 4.3, CMP otherwise normal. Hemoglobin A1c 6.7%. Cholesterol 135, triglycerides 69, HDL 44, LDL 77. TSH 3.9.  No flowsheet data found. No flowsheet data found. Lipid Panel  No results found for: CHOL, TRIG, HDL, CHOLHDL, VLDL, LDLCALC, LDLDIRECT HEMOGLOBIN A1C No results found for: HGBA1C, MPG TSH No results for input(s): TSH in the last 8760 hours.  PRN Meds:. Medications Discontinued During This Encounter  Medication Reason  . metFORMIN (GLUCOPHAGE-XR) 750 MG 24 hr tablet Error   Current Meds  Medication Sig  . aspirin EC 81 MG tablet Take 81 mg by mouth daily.  . diphenhydrAMINE (BENADRYL) 25 MG tablet  Take 1 tablet (25 mg total) by mouth 3 (three) times daily. Take one tablet three times daily for two days (Patient taking differently: Take 25 mg by mouth as needed. Take one tablet three times daily for two days)  . famotidine (PEPCID) 20 MG tablet Take 1 tablet (20 mg total) by mouth 2 (two) times daily. Take one tablet twice daily for two days  . JARDIANCE 10 MG TABS tablet TAKE 1 TABLET BY MOUTH DAILY  . losartan (COZAAR) 25 MG tablet Take 25 mg by mouth daily.  . metoprolol succinate (TOPROL-XL) 50 MG 24 hr tablet Take 1 tablet (50 mg total) by mouth daily.  . Multiple Vitamin (MULTIVITAMIN WITH MINERALS) TABS Take 1 tablet by mouth. occasionally  . rosuvastatin (CRESTOR) 20 MG tablet Take 20  mg by mouth at bedtime.    Cardiac Studies:   Exercise myoview stress 07/18/2018: 1. The patient performed treadmill exercise using Bruce protocol, completing 7:17 minutes. The patient completed an estimated workload of 9 METS, reaching 104% of the maximum predicted heart rate. Normal hemodynamic response seen. No stress symptoms reported. Exercise capacity was low normal. The stress electrocardiogram showed sinus tachycardia, normal stress conduction, occasional PAC and PVC, and normal stress repolarization. No ischemic changes seen on stress electrocardiogram. 2. The overall quality of the study is fair. There is no evidence of abnormal lung activity. Gated SPECT imaging reveals normal myocardial thickening and wall motion. The left ventricular ejection fraction was normal (54%). Stress and rest SPECT images demonstrate small sized, mild intensity, fixed perfusion defect in inferior myocardium. Given normal wall motion, this likely represents diaphragmatic attenuation artifact. 3. Low risk study.  Coronary angiogram 01/20/2016: Normal LVEF, 65%. Proximal RCA S/P 3.5 x 26 mm integrity bare-metal stent implantation. Distal RCA 50% at bifurcation, PDA 80% (small) mid LAD 40% stenosis.  Echocardiogram  07/09/2018: Left ventricle cavity is normal in size. Normal global wall motion. Calculated EF 55%. Left atrial cavity is mildly dilated. Mild (Grade I) mitral regurgitation. Mild tricuspid regurgitation. Estimated pulmonary artery systolic pressure 31-51 mmHg. IVC is dilated with blunted respiratory response. Estimated RA pressure 10-15 mmHg. Assessment:     ICD-10-CM   1. Atherosclerosis of native coronary artery of native heart without angina pectoris  I25.10   2. Shortness of breath  R06.02   3. Essential hypertension  I10   4. Pure hypercholesterolemia  E78.00     EKG 01/23/2019: Sinus bradycardia at 57 bpm, normal axis, no evidence of ischemia.   Recommendations:   Patient is here for 76-monthoffice visit and follow-up for CAD and shortness of breath.  Over the last few months his shortness of breath has improved.  He was started on Jardiance that he is tolerating well, but does continue report diabetes is uncontrolled.  He will continue to follow-up with endocrinology for management of this.  Of angina.  He has had low risk stress test in December 2019.  Blood pressure is well controlled.    Lipids are overall stable, but he is stated to have his labs performed again.  He is to see his PCP in the near future will have this performed at that time.  Overall, feel that patient is doing well.  I recommended that he continue with regular exercise and making diet changes to help improve his diabetes.  I will see him back in 6 months or sooner if problems.   John Dunn MSN, APRN, FNP-C [facility]. PBaldwinOffice: [phone]: [phone]

## 2019-06-18 ENCOUNTER — Other Ambulatory Visit: Payer: Self-pay

## 2019-06-18 DIAGNOSIS — I25118 Atherosclerotic heart disease of native coronary artery with other forms of angina pectoris: Secondary | ICD-10-CM

## 2019-06-18 MED ORDER — METOPROLOL SUCCINATE ER 50 MG PO TB24: 50.0000 mg | ORAL_TABLET | Freq: Every day | ORAL | 2 refills | Status: DC

## 2019-08-18 ENCOUNTER — Ambulatory Visit: Payer: Medicare Other | Attending: Internal Medicine

## 2019-08-18 DIAGNOSIS — Z23 Encounter for immunization: Secondary | ICD-10-CM

## 2019-08-18 NOTE — Progress Notes (Signed)
   Covid-19 Vaccination Clinic  Name:  John Shaffer    MRN: [id number] DOB: May 10, 1946  08/18/2019  Mr. John Shaffer was observed post Covid-19 immunization for 15 minutes without incidence. He was provided with Vaccine Information Sheet and instruction to access the V-Safe system.   Mr. John Shaffer was instructed to call 911 with any severe reactions post vaccine: Marland Kitchen Difficulty breathing  . Swelling of your face and throat  . A fast heartbeat  . A bad rash all over your body  . Dizziness and weakness    Immunizations Administered    Name Date Dose VIS Date Route   Pfizer COVID-19 Vaccine 08/18/2019  1:01 PM 0.3 mL 07/10/2019 Intramuscular   Manufacturer: [employer], [employer]   Lot: [id number]   NDC: [address]-7

## 2019-09-08 ENCOUNTER — Ambulatory Visit: Payer: Medicare Other | Attending: Internal Medicine

## 2019-09-08 DIAGNOSIS — Z23 Encounter for immunization: Secondary | ICD-10-CM | POA: Insufficient documentation

## 2019-09-08 NOTE — Progress Notes (Signed)
   Covid-19 Vaccination Clinic  Name:  John Shaffer    MRN: [id number] DOB: 18-Jun-1946  09/08/2019  [name] was observed post Covid-19 immunization for 15 minutes without incidence. He was provided with Vaccine Information Sheet and instruction to access the V-Safe system.   [name] was instructed to call 911 with any severe reactions post vaccine: Marland Kitchen Difficulty breathing  . Swelling of your face and throat  . A fast heartbeat  . A bad rash all over your body  . Dizziness and weakness    Immunizations Administered    Name Date Dose VIS Date Route   Pfizer COVID-19 Vaccine 09/08/2019  1:44 PM 0.3 mL 07/10/2019 Intramuscular   Manufacturer: [employer], [employer]   Lot: [id number]   NDC: [address]-4

## 2019-10-23 ENCOUNTER — Encounter: Payer: Self-pay | Admitting: Cardiology

## 2019-10-23 ENCOUNTER — Ambulatory Visit: Payer: Medicare Other | Admitting: Cardiology

## 2019-10-23 ENCOUNTER — Other Ambulatory Visit: Payer: Self-pay

## 2019-10-23 VITALS — BP 139/75 | HR 63 | Temp 97.6°F | Resp 16 | Ht 70.5 in | Wt 205.0 lb

## 2019-10-23 DIAGNOSIS — E7800 Pure hypercholesterolemia, unspecified: Secondary | ICD-10-CM

## 2019-10-23 DIAGNOSIS — E119 Type 2 diabetes mellitus without complications: Secondary | ICD-10-CM

## 2019-10-23 DIAGNOSIS — I2510 Atherosclerotic heart disease of native coronary artery without angina pectoris: Secondary | ICD-10-CM

## 2019-10-23 DIAGNOSIS — I10 Essential (primary) hypertension: Secondary | ICD-10-CM

## 2019-10-23 DIAGNOSIS — M5387 Other specified dorsopathies, lumbosacral region: Secondary | ICD-10-CM

## 2019-10-23 MED ORDER — JARDIANCE 25 MG PO TABS
25.0000 mg | ORAL_TABLET | Freq: Every day | ORAL | 3 refills | Status: DC
Start: 2019-10-23 — End: 2020-04-18

## 2019-10-23 NOTE — Progress Notes (Signed)
Primary Physician:  Jani Gravel, MD   Patient ID: John Shaffer, male    DOB: February 10, 1946, 74 y.o.   MRN: [id number]  Subjective:   Chief Complaint  Patient presents with  . Coronary Artery Disease  . Hip Pain    6 month OV    HPI: John Shaffer  is a 74 y.o. male  with CAD, hyperlipidemia, OSA on CPAP, and diabetes. He was in [place] on 01/17/2016, had NSTEMI and underwent coronary angiogram with stenting of the proximal RCA with Integrity 3.5 x 69m BMS. He was was found to have multiple lymph nodes in his groin and also in the abdomen and has had multiple lymphnodes but has remained stable over the past 2 years and now follwos Dr. BNicholas Losefrom surgical standpoint and scheduled for CT scan in July 2021.  He presents to the office requesting a visit due to severe pain in his hips, bilateral hips shooting down his legs, has not slept in several weeks.  He has also tried steroids with no relief.  Denies chest pain or dyspnea.  Past Medical History:  Diagnosis Date  . Atherosclerosis of native coronary artery of native heart with angina pectoris ([place] 01/23/2019  . Chest pain   . H/O exercise stress test    chets pain, atypical  . H/O non-ST elevation myocardial infarction (NSTEMI) 01/23/2019  . Hip pain, left   . HLD (hyperlipidemia)   . HTN (hypertension) 01/23/2019  . Hyperlipemia 01/23/2019    Past Surgical History:  Procedure Laterality Date  . SHOULDER SURGERY     Social History   Tobacco Use  . Smoking status: Former Smoker    Packs/day: 0.50    Types: Cigarettes    Quit date: 1989    Years since quitting: 32.2  . Smokeless tobacco: Never Used  . Tobacco comment: started smoking in 7th grade   Substance Use Topics  . Alcohol use: Yes    Comment: rarely     Review of Systems  Cardiovascular: Positive for dyspnea on exertion. Negative for chest pain and leg swelling.  Musculoskeletal: Positive for arthritis, back pain and joint pain.  Gastrointestinal:  Negative for melena.  Neurological: Positive for dizziness.   Objective:  Blood pressure 139/75, pulse 63, temperature 97.6 F (36.4 C), temperature source Temporal, resp. rate 16, height 5' 10.5" (1.791 m), weight 205 lb (93 kg), SpO2 98 %. Body mass index is 29 kg/m.   Vitals with BMI 10/23/2019 04/27/2019 01/23/2019  Height 5' 10.5" 5' 10.5" '5\' 10"'   Weight 205 lbs 205 lbs 13 oz 207 lbs  BMI 28.99 232.3255.7 Systolic [id number] Diastolic 75 70 79  Pulse 63 59 62      Physical Exam  Constitutional: Vital signs are normal. He appears well-developed and well-nourished.  Cardiovascular: Normal rate, regular rhythm, normal heart sounds, intact distal pulses and normal pulses.  Pulmonary/Chest: Effort normal and breath sounds normal. No accessory muscle usage. No respiratory distress.  Abdominal: Soft. Bowel sounds are normal.  Vitals reviewed.  Radiology: No results found.  Laboratory examination:   External labs:  labs 01/01/2019: Glucose 139, creatinine 1.12, EGFR 66, potassium 4.7, CMP otherwise normal. TSH normal.  11/20/2018: Hemoglobin A1c 6.8%. Cholesterol 109, triglycerides 146, HDL 27, LDL 53. CBC normal.  05/22/2018: CBC normal. Glucose 124, alkaline phos 35, creatinine 0.8, EGFR 90, potassium 4.3, CMP otherwise normal. Hemoglobin A1c 6.7%. Cholesterol 135, triglycerides 69, HDL 44, LDL 77. TSH 3.9.  PRN Meds:.  Medications Discontinued During This Encounter  Medication Reason  . famotidine (PEPCID) 20 MG tablet Patient Preference  . JARDIANCE 10 MG TABS tablet Discontinued by provider   Current Meds  Medication Sig  . aspirin EC 81 MG tablet Take 81 mg by mouth daily.  Marland Kitchen losartan (COZAAR) 25 MG tablet Take 25 mg by mouth daily.  . methocarbamol (ROBAXIN) 500 MG tablet Take 500 mg by mouth 4 (four) times daily.  . metoprolol succinate (TOPROL-XL) 50 MG 24 hr tablet Take 1 tablet (50 mg total) by mouth daily.  . Multiple Vitamin (MULTIVITAMIN WITH MINERALS) TABS  Take 1 tablet by mouth. occasionally  . pramipexole (MIRAPEX) 0.125 MG tablet Take 0.125 mg by mouth as needed.  . rosuvastatin (CRESTOR) 20 MG tablet Take 20 mg by mouth at bedtime.  . tamsulosin (FLOMAX) 0.4 MG CAPS capsule Take 0.4 mg by mouth as needed.  . [DISCONTINUED] JARDIANCE 10 MG TABS tablet TAKE 1 TABLET BY MOUTH DAILY    Cardiac Studies:   Exercise myoview stress 07/18/2018: 1. The patient performed treadmill exercise using Bruce protocol, completing 7:17 minutes. The patient completed an estimated workload of 9 METS, reaching 104% of the maximum predicted heart rate. Normal hemodynamic response seen. No stress symptoms reported. Exercise capacity was low normal. The stress electrocardiogram showed sinus tachycardia, normal stress conduction, occasional PAC and PVC, and normal stress repolarization. No ischemic changes seen on stress electrocardiogram. 2. The overall quality of the study is fair. There is no evidence of abnormal lung activity. Gated SPECT imaging reveals normal myocardial thickening and wall motion. The left ventricular ejection fraction was normal (54%). Stress and rest SPECT images demonstrate small sized, mild intensity, fixed perfusion defect in inferior myocardium. Given normal wall motion, this likely represents diaphragmatic attenuation artifact. 3. Low risk study.  Coronary angiogram 01/20/2016: Normal LVEF, 65%. Proximal RCA S/P 3.5 x 26 mm integrity bare-metal stent implantation. Distal RCA 50% at bifurcation, PDA 80% (small) mid LAD 40% stenosis.  Echocardiogram 07/09/2018: Left ventricle cavity is normal in size. Normal global wall motion. Calculated EF 55%. Left atrial cavity is mildly dilated. Mild (Grade I) mitral regurgitation. Mild tricuspid regurgitation. Estimated pulmonary artery systolic pressure 29-79 mmHg. IVC is dilated with blunted respiratory response. Estimated RA pressure 10-15 mmHg.  EKG  10/23/2019: Normal sinus rhythm with rate of 63  bpm, left atrial enlargement, normal axis.  No evidence of ischemia, normal EKG. no significant change from 01/23/2019.  Assessment:     ICD-10-CM   1. Atherosclerosis of native coronary artery of native heart without angina pectoris  I25.10 EKG 12-Lead  2. Essential hypertension  I10   3. Pure hypercholesterolemia  E78.00   4. Sciatica associated with disorder of lumbosacral spine  M53.87 Ambulatory referral to Orthodontics  5. Controlled type 2 diabetes mellitus without complication, without long-term current use of insulin (HCC)  E11.9   6. Type 2 diabetes mellitus without complication, without long-term current use of insulin (HCC)  E11.9 empagliflozin (JARDIANCE) 25 MG TABS tablet   Recommendations:   John Shaffer  is a 74 y.o. male  with CAD, hyperlipidemia, OSA on CPAP, and diabetes. He was in [place] on 01/17/2016, had NSTEMI and underwent coronary angiogram with stenting of the proximal RCA with Integrity 3.5 x 72m BMS. He was was found to have multiple lymph nodes in his groin and also in the abdomen and has had multiple lymphnodes but has remained stable over the past 2 years and now follwos Dr. BNicholas Losefrom surgical  standpoint and scheduled for CT scan in July 2021.  His main complaint being bilateral hip pain and shooting down his legs, last examination is completely normal.  I do not suspect vascular etiology.  He has none issues with his back and spine, I will refer him to be evaluated by Dr. Melina Schools for spinal stenosis.  From cardiac standpoint if he needs any surgical intervention, he is stable to undergo this.  Blood pressures well controlled, slightly elevated today due to pain.  Diabetes is improved since being on Jardiance, will increase it to 25 mg daily, he will continue to follow-up with his PCP regarding this.  Lipids are also controlled, previously he had discontinued statins but there was no change in his pain.  Do not suspect this is statin induced myopathy  either.  Office visit in a year or sooner if problems.  Adrian Prows, MD, [facility] 10/23/2019, 3:24 PM [facility] [place] Office: [phone]

## 2019-10-26 ENCOUNTER — Ambulatory Visit: Payer: Medicare Other | Admitting: Cardiology

## 2019-11-05 ENCOUNTER — Other Ambulatory Visit: Payer: Self-pay | Admitting: Orthopedic Surgery

## 2019-11-05 DIAGNOSIS — M259 Joint disorder, unspecified: Secondary | ICD-10-CM

## 2019-12-04 ENCOUNTER — Ambulatory Visit
Admission: RE | Admit: 2019-12-04 | Discharge: 2019-12-04 | Disposition: A | Discharge status: Home / Self Care | Payer: Medicare Other | Source: Ambulatory Visit | Attending: Orthopedic Surgery | Admitting: Orthopedic Surgery

## 2019-12-04 ENCOUNTER — Other Ambulatory Visit: Payer: Self-pay

## 2019-12-04 DIAGNOSIS — M259 Joint disorder, unspecified: Secondary | ICD-10-CM

## 2020-03-28 ENCOUNTER — Other Ambulatory Visit: Payer: Self-pay | Admitting: Cardiology

## 2020-03-28 DIAGNOSIS — I25118 Atherosclerotic heart disease of native coronary artery with other forms of angina pectoris: Secondary | ICD-10-CM

## 2020-04-18 ENCOUNTER — Other Ambulatory Visit: Payer: Self-pay

## 2020-04-18 ENCOUNTER — Encounter: Payer: Self-pay | Admitting: Surgery

## 2020-04-18 ENCOUNTER — Ambulatory Visit: Payer: Medicare Other | Admitting: Surgery

## 2020-04-18 VITALS — BP 119/78 | HR 58 | Temp 97.3°F | Resp 20 | Ht 70.5 in | Wt 214.0 lb

## 2020-04-18 DIAGNOSIS — I714 Abdominal aortic aneurysm, without rupture, unspecified: Secondary | ICD-10-CM

## 2020-04-18 NOTE — Progress Notes (Signed)
[facility]  Patient name: John Shaffer MRN: [id number] DOB: February 01, 1946 Sex: male   REQUESTING PROVIDER:    Marlene Lard   REASON FOR CONSULT:    AAA  HISTORY OF PRESENT ILLNESS:   John Shaffer is a 74 y.o. male, who is referred for evaluation of an abdominal aortic aneurysm.  This was recently visualized again on a CT scan for back pain.  Maximum aortic diameter was 3 cm.  This was increased from 2.8 cm on the CT scan in 2018.  He is not having any abdominal pain or back pain.  The patient takes a statin for hypercholesterolemia.  He is on an ARB for hypertension.  He is a former smoker.  He does suffer from diabetes.  He has a history of a non-ST elevation MI.  PAST MEDICAL HISTORY    Past Medical History:  Diagnosis Date  . AAA (abdominal aortic aneurysm) (HCC)   . Atherosclerosis of native coronary artery of native heart with angina pectoris (HCC) 01/23/2019  . Diabetes mellitus without complication (HCC)   . H/O exercise stress test    chets pain, atypical  . H/O non-ST elevation myocardial infarction (NSTEMI) 01/23/2019  . Hip pain, left   . HLD (hyperlipidemia)   . HTN (hypertension) 01/23/2019  . Hyperlipemia 01/23/2019     FAMILY HISTORY   Family History  Problem Relation Age of Onset  . Lung disease Mother   . Heart failure Father   . Cancer Maternal Grandmother   . Lung disease Maternal Grandfather   . Cancer Paternal Grandfather     SOCIAL HISTORY:   Social History   Socioeconomic History  . Marital status: Divorced    Spouse name: Not on file  . Number of children: 1  . Years of education: Not on file  . Highest education level: Not on file  Occupational History  . Not on file  Tobacco Use  . Smoking status: Former Smoker    Packs/day: 0.50    Types: Cigarettes    Quit date: 1989    Years since quitting: 32.7  . Smokeless tobacco: Never Used  . Tobacco comment:  started smoking in 7th grade   Vaping Use  . Vaping Use: Never used  Substance and Sexual Activity  . Alcohol use: Yes    Comment: rarely   . Drug use: No  . Sexual activity: Not on file  Other Topics Concern  . Not on file  Social History Narrative  . Not on file   Social Determinants of Health   Financial Resource Strain:   . Difficulty of Paying Living Expenses: Not on file  Food Insecurity:   . Worried About Programme researcher, broadcasting/film/video in the Last Year: Not on file  . Ran Out of Food in the Last Year: Not on file  Transportation Needs:   . Lack of Transportation (Medical): Not on file  . Lack of Transportation (Non-Medical): Not on file  Physical Activity:   . Days of Exercise per Week: Not on file  . Minutes of Exercise per Session: Not on file  Stress:   . Feeling of Stress : Not on file  Social Connections:   . Frequency of Communication with Friends and Family: Not on file  . Frequency of Social Gatherings with Friends and Family: Not on file  . Attends Religious Services: Not on file  . Active Member of Clubs or Organizations: Not on file  . Attends Club  or Organization Meetings: Not on file  . Marital Status: Not on file  Intimate Partner Violence:   . Fear of Current or Ex-Partner: Not on file  . Emotionally Abused: Not on file  . Physically Abused: Not on file  . Sexually Abused: Not on file    ALLERGIES:    Allergies  Allergen Reactions  . Bee Venom Swelling    Wasp/ cause lips and feet to swell    CURRENT MEDICATIONS:    Current Outpatient Medications  Medication Sig Dispense Refill  . alfuzosin (UROXATRAL) 10 MG 24 hr tablet Take 10 mg by mouth daily.    Marland Kitchen aspirin EC 81 MG tablet Take 81 mg by mouth daily.    . diphenhydrAMINE (BENADRYL) 25 MG tablet Take 1 tablet (25 mg total) by mouth 3 (three) times daily. Take one tablet three times daily for two days 10 tablet 0  . glimepiride (AMARYL) 2 MG tablet Take 2 mg by mouth every morning.    Marland Kitchen losartan  (COZAAR) 25 MG tablet Take 25 mg by mouth daily.    . metFORMIN (GLUCOPHAGE-XR) 750 MG 24 hr tablet Take 750 mg by mouth 2 (two) times daily.    . metoprolol succinate (TOPROL-XL) 50 MG 24 hr tablet TAKE 1 TABLET(50 MG) BY MOUTH DAILY 90 tablet 2  . Multiple Vitamin (MULTIVITAMIN WITH MINERALS) TABS Take 1 tablet by mouth. occasionally    . rosuvastatin (CRESTOR) 40 MG tablet Take 20 mg by mouth daily.    . tamsulosin (FLOMAX) 0.4 MG CAPS capsule Take 0.4 mg by mouth as needed.    . pramipexole (MIRAPEX) 0.125 MG tablet Take 0.125 mg by mouth as needed. (Patient not taking: Reported on 04/18/2020)     No current facility-administered medications for this visit.    REVIEW OF SYSTEMS:   [X]  denotes positive finding, [ ]  denotes negative finding Cardiac  Comments:  Chest pain or chest pressure:    Shortness of breath upon exertion:    Short of breath when lying flat:    Irregular heart rhythm:        Vascular    Pain in calf, thigh, or hip brought on by ambulation: x   Pain in feet at night that wakes you up from your sleep:     Blood clot in your veins:    Leg swelling:         Pulmonary    Oxygen at home:    Productive cough:     Wheezing:         Neurologic    Sudden weakness in arms or legs:     Sudden numbness in arms or legs:     Sudden onset of difficulty speaking or slurred speech:    Temporary loss of vision in one eye:     Problems with dizziness:         Gastrointestinal    Blood in stool:      Vomited blood:         Genitourinary    Burning when urinating:     Blood in urine:        Psychiatric    Major depression:         Hematologic    Bleeding problems:    Problems with blood clotting too easily:        Skin    Rashes or ulcers:        Constitutional    Fever or chills:     PHYSICAL EXAM:  Vitals:   04/18/20 0903  BP: 119/78  Pulse: (!) 58  Resp: 20  Temp: (!) 97.3 F (36.3 C)  SpO2: 93%  Weight: 214 lb (97.1 kg)  Height: 5' 10.5"  (1.791 m)    GENERAL: The patient is a well-nourished male, in no acute distress. The vital signs are documented above. CARDIAC: There is a regular rate and rhythm.  VASCULAR: Pulses PULMONARY: Nonlabored respirations ABDOMEN: Soft and non-tender.  No pulsatile mass MUSCULOSKELETAL: There are no major deformities or cyanosis. NEUROLOGIC: No focal weakness or paresthesias are detected. SKIN: There are no ulcers or rashes noted. PSYCHIATRIC: The patient has a normal affect.  STUDIES:   I have reviewed his CT scan from the [place] which shows a distal aortic aneurysm with maximum diameter of 3 cm.  ASSESSMENT and PLAN   Small AAA: I discussed with the patient that I would not consider intervention until his aneurysm becomes greater than 5 cm, unless he has rapid growth or develops symptoms.  I will place him on surveillance.  He will come back in 1 year for an abdominal ultrasound.   Charlena Cross, MD, FACS [facility] [phone] Pager [phone]

## 2020-09-23 ENCOUNTER — Ambulatory Visit: Payer: Medicare Other | Admitting: Student

## 2020-09-23 ENCOUNTER — Encounter: Payer: Self-pay | Admitting: Student

## 2020-09-23 ENCOUNTER — Other Ambulatory Visit: Payer: Self-pay

## 2020-09-23 VITALS — BP 117/53 | HR 60 | Temp 97.8°F | Resp 16 | Ht 70.5 in | Wt 216.0 lb

## 2020-09-23 DIAGNOSIS — I25118 Atherosclerotic heart disease of native coronary artery with other forms of angina pectoris: Secondary | ICD-10-CM

## 2020-09-23 DIAGNOSIS — R0602 Shortness of breath: Secondary | ICD-10-CM

## 2020-09-23 MED ORDER — METOPROLOL SUCCINATE ER 50 MG PO TB24: 25.0000 mg | ORAL_TABLET | Freq: Every day | ORAL | 2 refills | Status: DC

## 2020-09-23 NOTE — Progress Notes (Signed)
Primary Physician:  John Gravel, MD   Patient ID: John Shaffer, male    DOB: [birth year], 75 y.o.   MRN: [id number]  Subjective:   Chief Complaint  Patient presents with  . Shortness of Breath    HPI: John Shaffer  is a 75 y.o. male  with CAD, hyperlipidemia, OSA on CPAP, and diabetes. He was in [place] on 01/17/2016, had NSTEMI and underwent coronary angiogram with stenting of the proximal RCA with Integrity 3.5 x 90m BMS. He was was found to have multiple lymph nodes in his groin and also in the abdomen and has had multiple lymphnodes but has remained stable over the past 2 years and now follwos Dr. BNicholas Losefrom surgical standpoint and scheduled for CT scan in July 2021.  [name] presents for urgent visit with complaints of shortness of breath particularly on exertion over the last few months.  He does note that he has gained weight.  He continues to walk his dog each morning without issue, although he reports this is at a slow pace.  He has also developed a cough over the last 2 days.  Denies chest pain, dizziness, palpitations, syncope, near syncope, leg swelling.  He does note that he occasionally checks his blood pressure at home when he is feeling dyspnea, and notes that it seems to be lower when he is short of breath, particularly his diastolic blood pressure.  Past Medical History:  Diagnosis Date  . AAA (abdominal aortic aneurysm) ([facility]   . Atherosclerosis of native coronary artery of native heart with angina pectoris ([facility] 01/23/2019  . Diabetes mellitus without complication ([facility]   . H/O exercise stress test    chets pain, atypical  . H/O non-ST elevation myocardial infarction (NSTEMI) 01/23/2019  . Hip pain, left   . HLD (hyperlipidemia)   . HTN (hypertension) 01/23/2019  . Hyperlipemia 01/23/2019    Past Surgical History:  Procedure Laterality Date  . SHOULDER SURGERY     Social History   Tobacco Use  . Smoking status: Former Smoker    Packs/day: 0.50     Types: Cigarettes    Quit date: 1989    Years since quitting: 33.1  . Smokeless tobacco: Never Used  . Tobacco comment: started smoking in 7th grade   Substance Use Topics  . Alcohol use: Yes    Comment: rarely   Marital Status: Divorced  ROS   Review of Systems  Constitutional: Negative for malaise/fatigue and weight gain.  Cardiovascular: Positive for dyspnea on exertion. Negative for chest pain, claudication, leg swelling, near-syncope, orthopnea, palpitations, paroxysmal nocturnal dyspnea and syncope.  Respiratory: Negative for shortness of breath.   Hematologic/Lymphatic: Does not bruise/bleed easily.  Musculoskeletal: Positive for arthritis, back pain and joint pain.  Gastrointestinal: Negative for melena.  Neurological: Positive for dizziness. Negative for weakness.   Objective:  Blood pressure (!) 117/53, pulse 60, temperature 97.8 F (36.6 C), temperature source Temporal, resp. rate 16, height 5' 10.5" (1.791 m), weight 216 lb (98 kg), SpO2 94 %. Body mass index is 30.55 kg/m.   Vitals with BMI 09/23/2020 04/18/2020 10/23/2019  Height 5' 10.5" 5' 10.5" 5' 10.5"  Weight 216 lbs 214 lbs 205 lbs  BMI 30.54 335.68261.68 Systolic [id number] Diastolic 53 78 75  Pulse 60 58 63      Physical Exam Vitals reviewed.  Constitutional:      Appearance: He is well-developed.  HENT:     Head: Normocephalic and atraumatic.  Cardiovascular:     Rate and Rhythm: Normal rate and regular rhythm.     Pulses: Normal pulses and intact distal pulses.     Heart sounds: Normal heart sounds, S1 normal and S2 normal. No murmur heard. No gallop.   Pulmonary:     Effort: Pulmonary effort is normal. No accessory muscle usage or respiratory distress.     Breath sounds: Normal breath sounds. No wheezing, rhonchi or rales.  Abdominal:     General: Bowel sounds are normal.     Palpations: Abdomen is soft.  Musculoskeletal:     Right lower leg: No edema.     Left lower leg: No edema.   Neurological:     Mental Status: He is alert.    Radiology: No results found.  Laboratory examination:  No flowsheet data found. No flowsheet data found. Lipid Panel  No results found for: CHOL, TRIG, HDL, CHOLHDL, VLDL, LDLCALC, LDLDIRECT HEMOGLOBIN A1C No results found for: HGBA1C, MPG TSH No results for input(s): TSH in the last 8760 hours.  External labs:  01/01/2019: Glucose 139, creatinine 1.12, EGFR 66, potassium 4.7, CMP otherwise normal. TSH normal.  11/20/2018:  Hemoglobin A1c 6.8%. Cholesterol 109, triglycerides 146, HDL 27, LDL 53. CBC normal.  05/22/2018:  CBC normal. Glucose 124, alkaline phos 35, creatinine 0.8, EGFR 90, potassium 4.3, CMP otherwise normal. Hemoglobin A1c 6.7%. Cholesterol 135, triglycerides 69, HDL 44, LDL 77. TSH 3.9.   Allergies and Medications    Allergies  Allergen Reactions  . Bee Venom Swelling    Wasp/ cause lips and feet to swell   Current Outpatient Medications  Medication Instructions  . alfuzosin (UROXATRAL) 10 mg, Oral, Daily  . aspirin EC 81 mg, Oral, Daily  . glimepiride (AMARYL) 2 mg, Oral, Every morning  . losartan (COZAAR) 25 mg, Oral, Daily  . metFORMIN (GLUCOPHAGE-XR) 750 mg, Oral, Daily  . metoprolol succinate (TOPROL-XL) 25 mg, Oral, Daily, Take with or immediately following a meal.  . Multiple Vitamin (MULTIVITAMIN WITH MINERALS) TABS 1 tablet, Oral, occasionally  . rosuvastatin (CRESTOR) 20 mg, Oral, Daily   Cardiac Studies:   Exercise myoview stress 07/18/2018: 1. The patient performed treadmill exercise using Bruce protocol, completing 7:17 minutes. The patient completed an estimated workload of 9 METS, reaching 104% of the maximum predicted heart rate. Normal hemodynamic response seen. No stress symptoms reported. Exercise capacity was low normal. The stress electrocardiogram showed sinus tachycardia, normal stress conduction, occasional PAC and PVC, and normal stress repolarization. No ischemic changes  seen on stress electrocardiogram. 2. The overall quality of the study is fair. There is no evidence of abnormal lung activity. Gated SPECT imaging reveals normal myocardial thickening and wall motion. The left ventricular ejection fraction was normal (54%). Stress and rest SPECT images demonstrate small sized, mild intensity, fixed perfusion defect in inferior myocardium. Given normal wall motion, this likely represents diaphragmatic attenuation artifact. 3. Low risk study.  Echocardiogram 07/09/2018: Left ventricle cavity is normal in size. Normal global wall motion. Calculated EF 55%. Left atrial cavity is mildly dilated. Mild (Grade I) mitral regurgitation. Mild tricuspid regurgitation. Estimated pulmonary artery systolic pressure 85-63 mmHg. IVC is dilated with blunted respiratory response. Estimated RA pressure 10-15 mmHg.  Coronary angiogram 01/20/2016: Normal LVEF, 65%. Proximal RCA S/P 3.5 x 26 mm integrity bare-metal stent implantation. Distal RCA 50% at bifurcation, PDA 80% (small) mid LAD 40% stenosis.  EKG:   09/23/2020: Sinus rhythm at a rate of 60 bpm with 1 PAC.  Left atrial enlargement.  Normal axis.Objective abnormality.  Compared to EKG 10/23/2019, no significant change.   10/23/2019: Normal sinus rhythm with rate of 63 bpm, left atrial enlargement, normal axis.  No evidence of ischemia, normal EKG. no significant change from 01/23/2019.  Assessment:     ICD-10-CM   1. Shortness of breath  R06.02 EKG 12-Lead  2. Coronary artery disease of native artery of native heart with stable angina pectoris (HCC)  I25.118 metoprolol succinate (TOPROL-XL) 50 MG 24 hr tablet    Meds ordered this encounter  Medications  . metoprolol succinate (TOPROL-XL) 50 MG 24 hr tablet    Sig: Take 0.5 tablets (25 mg total) by mouth daily. Take with or immediately following a meal.    Dispense:  90 tablet    Refill:  2   Medications Discontinued During This Encounter  Medication Reason  .  diphenhydrAMINE (BENADRYL) 25 MG tablet Error  . pramipexole (MIRAPEX) 0.125 MG tablet Error  . tamsulosin (FLOMAX) 0.4 MG CAPS capsule Error  . metoprolol succinate (TOPROL-XL) 50 MG 24 hr tablet     Recommendations:   MIKEN STECHER  is a 75 y.o. male  with CAD, hyperlipidemia, OSA on CPAP, and diabetes. He was in [place] on 01/17/2016, had NSTEMI and underwent coronary angiogram with stenting of the proximal RCA with Integrity 3.5 x 50m BMS. He was was found to have multiple lymph nodes in his groin and also in the abdomen and has had multiple lymphnodes but has remained stable over the past 2 years and now follwos Dr. BNicholas Losefrom surgical standpoint and scheduled for CT scan in July 2021.  From cardiac standpoint if he needs any surgical intervention for his orthopedic pain, he is stable to undergo this.  Patient presents for urgent visit with complaints of shortness of breath.  EKG remained stable compared to previous without evidence of ischemia or infarct.  His blood pressure stopped in the office today.  Suspect low blood pressure may be contributing to patient's symptoms of dyspnea.  Discussed with patient regarding management options including reducing antihypertensive medications versus proceeding with echocardiogram, labs, and stress test.  Patient prefers to start by reducing antihypertensive medications and will notify our office if symptoms worsen or fail to improve at which time would recommend proceeding with further evaluation.  Will reduce metoprolol succinate from 50 mg to 25 mg daily.  Patient will monitor his blood pressure on a daily basis and report to our office in 2 weeks if symptoms have not improved.  Counseled patient regarding signs and symptoms that would warrant emergent evaluation, he verbalized understanding agreement.  Follow-up in 8 weeks, sooner if needed, for dyspnea.   CAlethia Berthold PA-C 09/23/2020, 4:19 PM Office: [phone]

## 2020-10-18 ENCOUNTER — Telehealth: Payer: Self-pay

## 2020-10-18 ENCOUNTER — Other Ambulatory Visit: Payer: Self-pay | Admitting: Student

## 2020-10-18 DIAGNOSIS — I25118 Atherosclerotic heart disease of native coronary artery with other forms of angina pectoris: Secondary | ICD-10-CM

## 2020-10-18 MED ORDER — METOPROLOL SUCCINATE ER 25 MG PO TB24: 12.5000 mg | ORAL_TABLET | Freq: Every day | ORAL | 3 refills | Status: DC

## 2020-10-18 NOTE — Telephone Encounter (Signed)
Patient called that he has low blood pressure and on his last ov you cut his metoprolol in half his most recent readings over the weekend were 101/67, 118/71 patient asked to speak to you regarding his low bp.   Best number to contact patient is either his home or cell phone  [phone] cell [phone] home

## 2020-10-18 NOTE — Progress Notes (Signed)
To new team to have symptomatic hypotension, therefore reduce metoprolol succinate to 12.5 mg once daily.  Patient will keep follow-up appointment.

## 2020-10-24 ENCOUNTER — Ambulatory Visit: Payer: Medicare Other | Admitting: Cardiology

## 2020-11-18 ENCOUNTER — Ambulatory Visit: Payer: Medicare Other | Admitting: Student

## 2020-11-22 ENCOUNTER — Ambulatory Visit: Payer: Medicare Other | Admitting: Student

## 2020-11-22 ENCOUNTER — Other Ambulatory Visit: Payer: Self-pay

## 2020-11-22 ENCOUNTER — Encounter: Payer: Self-pay | Admitting: Student

## 2020-11-22 VITALS — BP 124/64 | HR 58 | Temp 97.7°F | Ht 70.5 in | Wt 209.0 lb

## 2020-11-22 DIAGNOSIS — I25118 Atherosclerotic heart disease of native coronary artery with other forms of angina pectoris: Secondary | ICD-10-CM

## 2020-11-22 DIAGNOSIS — R0609 Other forms of dyspnea: Secondary | ICD-10-CM

## 2020-11-22 DIAGNOSIS — R5383 Other fatigue: Secondary | ICD-10-CM

## 2020-11-22 DIAGNOSIS — R0600 Dyspnea, unspecified: Secondary | ICD-10-CM

## 2020-11-22 DIAGNOSIS — E119 Type 2 diabetes mellitus without complications: Secondary | ICD-10-CM

## 2020-11-22 NOTE — Progress Notes (Signed)
Primary Physician:  Jani Gravel, MD   Patient ID: John Shaffer, male    DOB: 1946-04-03, 75 y.o.   MRN: [id number]  Subjective:   Chief Complaint  Patient presents with  . Shortness of Breath  . Follow-up    HPI: John Shaffer  is a 75 y.o. male  with CAD, hyperlipidemia, OSA on CPAP, and diabetes. He was in [place] on 01/17/2016, had NSTEMI and underwent coronary angiogram with stenting of the proximal RCA with Integrity 3.5 x 80m BMS. He was was found to have multiple lymph nodes in his groin and also in the abdomen and has had multiple lymphnodes but has remained stable over the past 2 years and now follwos Dr. BNicholas Losefrom surgical standpoint and scheduled for CT scan in July 2021.  Patient presents for 8 week follow up for dyspnea. At last visit patient was noted to be bradycardiac and blood pressure soft. Therefore metoprolol succinate was reduced from 50 mg to 25 mg daily. Patient called the office and reported continued orthostatic symptoms, therefore metoprolol was further reduced for 12.5 mg daily.   Patient states he is now feeling much improved overall compared to pervious visit. He has made significant diet changes and his A1C has improved to 6.7%. He noted increased energy since making diet modifications. However, continues to have dyspnea on exertion particularly while walking uphill. Denies chest pain, palpitations, syncope, near syncope.   Patient has also expressed concern that his blood pressure monitor intermittently reads that he has "cardiac arrhythmia".  Past Medical History:  Diagnosis Date  . AAA (abdominal aortic aneurysm) ([facility]   . Atherosclerosis of native coronary artery of native heart with angina pectoris ([facility] 01/23/2019  . Diabetes mellitus without complication ([facility]   . H/O exercise stress test    chets pain, atypical  . H/O non-ST elevation myocardial infarction (NSTEMI) 01/23/2019  . Hip pain, left   . HLD (hyperlipidemia)   . HTN  (hypertension) 01/23/2019  . Hyperlipemia 01/23/2019   Family History  Problem Relation Age of Onset  . Lung disease Mother   . Heart failure Father   . Cancer Maternal Grandmother   . Lung disease Maternal Grandfather   . Cancer Paternal Grandfather    Past Surgical History:  Procedure Laterality Date  . SHOULDER SURGERY     Social History   Tobacco Use  . Smoking status: Former Smoker    Packs/day: 0.50    Types: Cigarettes    Quit date: 1989    Years since quitting: 33.3  . Smokeless tobacco: Never Used  . Tobacco comment: started smoking in 7th grade   Substance Use Topics  . Alcohol use: Yes    Comment: rarely   Marital Status: Divorced  ROS   Review of Systems  Constitutional: Negative for malaise/fatigue and weight gain.  Cardiovascular: Positive for dyspnea on exertion. Negative for chest pain, claudication, leg swelling, near-syncope, orthopnea, palpitations, paroxysmal nocturnal dyspnea and syncope.  Respiratory: Negative for shortness of breath.   Hematologic/Lymphatic: Does not bruise/bleed easily.  Musculoskeletal: Positive for arthritis, back pain and joint pain.  Gastrointestinal: Negative for melena.  Neurological: Negative for dizziness and weakness.   Objective:  Blood pressure 124/64, pulse (!) 58, temperature 97.7 F (36.5 C), height 5' 10.5" (1.791 m), weight 209 lb (94.8 kg), SpO2 96 %. Body mass index is 29.56 kg/m.   Vitals with BMI 11/22/2020 09/23/2020 04/18/2020  Height 5' 10.5" 5' 10.5" 5' 10.5"  Weight 209 lbs 216 lbs  214 lbs  BMI 29.55 67.12 45.80  Systolic 998 338 250  Diastolic 64 53 78  Pulse 58 60 58      Physical Exam Vitals reviewed.  Constitutional:      Appearance: He is well-developed.  HENT:     Head: Normocephalic and atraumatic.  Cardiovascular:     Rate and Rhythm: Normal rate and regular rhythm.     Pulses: Normal pulses and intact distal pulses.     Heart sounds: Normal heart sounds, S1 normal and S2 normal. No  murmur heard. No gallop.   Pulmonary:     Effort: Pulmonary effort is normal. No accessory muscle usage or respiratory distress.     Breath sounds: Normal breath sounds. No wheezing, rhonchi or rales.  Musculoskeletal:     Right lower leg: No edema.     Left lower leg: No edema.  Neurological:     Mental Status: He is alert.    Radiology: No results found.  Laboratory examination:  No flowsheet data found. No flowsheet data found. Lipid Panel  No results found for: CHOL, TRIG, HDL, CHOLHDL, VLDL, LDLCALC, LDLDIRECT HEMOGLOBIN A1C No results found for: HGBA1C, MPG TSH No results for input(s): TSH in the last 8760 hours.  External labs:  01/01/2019: Glucose 139, creatinine 1.12, EGFR 66, potassium 4.7, CMP otherwise normal. TSH normal.  11/20/2018:  Hemoglobin A1c 6.8%. Cholesterol 109, triglycerides 146, HDL 27, LDL 53. CBC normal.  05/22/2018:  CBC normal. Glucose 124, alkaline phos 35, creatinine 0.8, EGFR 90, potassium 4.3, CMP otherwise normal. Hemoglobin A1c 6.7%. Cholesterol 135, triglycerides 69, HDL 44, LDL 77. TSH 3.9.   Allergies and Medications    Allergies  Allergen Reactions  . Bee Venom Swelling    Wasp/ cause lips and feet to swell   Current Outpatient Medications  Medication Instructions  . alfuzosin (UROXATRAL) 10 mg, Oral, Daily  . aspirin EC 81 mg, Oral, Daily  . losartan (COZAAR) 25 mg, Oral, Daily  . metFORMIN (GLUCOPHAGE-XR) 750 mg, Oral, Daily  . metoprolol succinate (TOPROL-XL) 12.5 mg, Oral, Daily, Take with or immediately following a meal.  . Multiple Vitamin (MULTIVITAMIN WITH MINERALS) TABS 1 tablet, Oral, occasionally  . rosuvastatin (CRESTOR) 20 mg, Oral, Daily   Cardiac Studies:   Exercise myoview stress 07/18/2018: 1. The patient performed treadmill exercise using Bruce protocol, completing 7:17 minutes. The patient completed an estimated workload of 9 METS, reaching 104% of the maximum predicted heart rate. Normal hemodynamic  response seen. No stress symptoms reported. Exercise capacity was low normal. The stress electrocardiogram showed sinus tachycardia, normal stress conduction, occasional PAC and PVC, and normal stress repolarization. No ischemic changes seen on stress electrocardiogram. 2. The overall quality of the study is fair. There is no evidence of abnormal lung activity. Gated SPECT imaging reveals normal myocardial thickening and wall motion. The left ventricular ejection fraction was normal (54%). Stress and rest SPECT images demonstrate small sized, mild intensity, fixed perfusion defect in inferior myocardium. Given normal wall motion, this likely represents diaphragmatic attenuation artifact. 3. Low risk study.  Echocardiogram 07/09/2018: Left ventricle cavity is normal in size. Normal global wall motion. Calculated EF 55%. Left atrial cavity is mildly dilated. Mild (Grade I) mitral regurgitation. Mild tricuspid regurgitation. Estimated pulmonary artery systolic pressure 53-97 mmHg. IVC is dilated with blunted respiratory response. Estimated RA pressure 10-15 mmHg.  Coronary angiogram 01/20/2016: Normal LVEF, 65%. Proximal RCA S/P 3.5 x 26 mm integrity bare-metal stent implantation. Distal RCA 50% at bifurcation, PDA 80% (small)  mid LAD 40% stenosis.  EKG:   09/23/2020: Sinus rhythm at a rate of 60 bpm with 1 PAC.  Left atrial enlargement.  Normal axis. Objective abnormality.  Compared to EKG 10/23/2019, no significant change.   10/23/2019: Normal sinus rhythm with rate of 63 bpm, left atrial enlargement, normal axis.  No evidence of ischemia, normal EKG. no significant change from 01/23/2019.  Assessment:     ICD-10-CM   1. Dyspnea on exertion  R06.00 PCV ECHOCARDIOGRAM COMPLETE    PCV MYOCARDIAL PERFUSION WO LEXISCAN    LONG TERM MONITOR (3-14 DAYS)  2. Other fatigue  R53.83 PCV ECHOCARDIOGRAM COMPLETE    PCV MYOCARDIAL PERFUSION WO LEXISCAN    LONG TERM MONITOR (3-14 DAYS)  3. Coronary artery  disease of native artery of native heart with stable angina pectoris ([facility])  I25.118   4. Controlled type 2 diabetes mellitus without complication, without long-term current use of insulin (HCC)  E11.9     No orders of the defined types were placed in this encounter.  Medications Discontinued During This Encounter  Medication Reason  . losartan (COZAAR) 50 MG tablet Error  . metoprolol succinate (TOPROL-XL) 50 MG 24 hr tablet Error  . glimepiride (AMARYL) 2 MG tablet Error  . tobramycin (TOBREX) 0.3 % ophthalmic solution Error    Recommendations:   John Shaffer  is a 75 y.o. male  with CAD, hyperlipidemia, OSA on CPAP, and diabetes. He was in [place] on 01/17/2016, had NSTEMI and underwent coronary angiogram with stenting of the proximal RCA with Integrity 3.5 x 36m BMS. He was was found to have multiple lymph nodes in his groin and also in the abdomen and has had multiple lymphnodes but has remained stable over the past 2 years and now follwos Dr. BNicholas Losefrom surgical standpoint and scheduled for CT scan in July 2021.  From cardiac standpoint if he needs any surgical intervention for his orthopedic pain, he is stable to undergo this.  Patient presents for 8-week follow-up of dyspnea.  Patient states he is feeling better overall with diet modifications and improved A1c, particularly with increased energy.  However he continues to have dyspnea on exertion particularly while walking his dog uphill.  Patient has known CAD, most recent stress test in 2019.  Given symptoms may be anginal equivalent we will repeat nuclear stress test at this time.  We will also obtain repeat echocardiogram to evaluate LV function.  In regard to patient's question of "cardiac arrhythmia" reported by blood pressure machine he does have symptoms of fatigue and dyspnea on exertion, we will therefore obtain 2-week ambulatory cardiac telemetry to evaluate for underlying cardiac arrhythmia or AV block.  Follow-up in  8 weeks, sooner if needed, for results of cardiac testing.   CAlethia Berthold PA-C 11/22/2020, 5:08 PM Office: [phone]

## 2020-11-29 ENCOUNTER — Other Ambulatory Visit: Payer: Medicare Other

## 2020-12-12 ENCOUNTER — Other Ambulatory Visit: Payer: Self-pay

## 2020-12-12 ENCOUNTER — Ambulatory Visit: Payer: Medicare Other

## 2020-12-12 DIAGNOSIS — R0609 Other forms of dyspnea: Secondary | ICD-10-CM

## 2020-12-12 DIAGNOSIS — R5383 Other fatigue: Secondary | ICD-10-CM

## 2020-12-12 DIAGNOSIS — R0600 Dyspnea, unspecified: Secondary | ICD-10-CM

## 2020-12-12 NOTE — Progress Notes (Signed)
Please inform patient stress test was low risk.

## 2020-12-13 ENCOUNTER — Inpatient Hospital Stay: Payer: Medicare Other

## 2020-12-13 ENCOUNTER — Ambulatory Visit: Payer: Medicare Other

## 2020-12-13 DIAGNOSIS — R0600 Dyspnea, unspecified: Secondary | ICD-10-CM

## 2020-12-13 DIAGNOSIS — R5383 Other fatigue: Secondary | ICD-10-CM

## 2020-12-13 DIAGNOSIS — R0609 Other forms of dyspnea: Secondary | ICD-10-CM

## 2020-12-13 NOTE — Progress Notes (Signed)
Called pt no answer, left a vm

## 2020-12-13 NOTE — Progress Notes (Signed)
Patient called back he is aware of his results

## 2020-12-19 NOTE — Progress Notes (Signed)
Echo is stable, will discuss further at upcoming visit.

## 2020-12-20 NOTE — Progress Notes (Signed)
Called and spoke with patient regarding his echocardiogram results.  ?

## 2020-12-31 ENCOUNTER — Other Ambulatory Visit: Payer: Self-pay

## 2020-12-31 DIAGNOSIS — I714 Abdominal aortic aneurysm, without rupture, unspecified: Secondary | ICD-10-CM

## 2021-01-06 NOTE — Progress Notes (Signed)
Please notify patient he had frequent PACs which are extra heartbeats as well as episodes of fast heart rate which correlated with his symptoms.  We will discuss further at upcoming office visit.

## 2021-01-06 NOTE — Progress Notes (Signed)
Called and spoke with patient regarding his heart monitor results.

## 2021-01-17 ENCOUNTER — Ambulatory Visit: Payer: Medicare Other | Admitting: Student

## 2021-01-17 ENCOUNTER — Other Ambulatory Visit: Payer: Self-pay

## 2021-01-17 ENCOUNTER — Encounter: Payer: Self-pay | Admitting: Student

## 2021-01-17 VITALS — BP 129/74 | HR 66 | Temp 97.5°F | Ht 70.0 in | Wt 211.0 lb

## 2021-01-17 DIAGNOSIS — I10 Essential (primary) hypertension: Secondary | ICD-10-CM

## 2021-01-17 DIAGNOSIS — I25118 Atherosclerotic heart disease of native coronary artery with other forms of angina pectoris: Secondary | ICD-10-CM

## 2021-01-17 DIAGNOSIS — R0600 Dyspnea, unspecified: Secondary | ICD-10-CM

## 2021-01-17 DIAGNOSIS — R0609 Other forms of dyspnea: Secondary | ICD-10-CM

## 2021-01-17 DIAGNOSIS — I491 Atrial premature depolarization: Secondary | ICD-10-CM

## 2021-01-17 DIAGNOSIS — I471 Supraventricular tachycardia: Secondary | ICD-10-CM

## 2021-01-17 MED ORDER — ROSUVASTATIN CALCIUM 40 MG PO TABS: 20.0000 mg | ORAL_TABLET | Freq: Every day | ORAL | 3 refills | Status: DC

## 2021-01-17 MED ORDER — METOPROLOL TARTRATE 25 MG PO TABS: 12.5000 mg | ORAL_TABLET | Freq: Two times a day (BID) | ORAL | 3 refills | Status: DC

## 2021-01-17 NOTE — Addendum Note (Signed)
Addended by: Elvin So L on: 01/17/2021 11:20 AM   Modules accepted: Orders

## 2021-01-17 NOTE — Progress Notes (Signed)
Primary Physician:  Jani Gravel, MD   Patient ID: John Shaffer, male    DOB: 1945-10-08, 75 y.o.   MRN: [id number]  Subjective:   Chief Complaint  Patient presents with   Shortness of Breath   Follow-up   Results    HPI: John Shaffer  is a 75 y.o. male  with CAD, hyperlipidemia, OSA on CPAP, and diabetes. He was in [place] on 01/17/2016, had NSTEMI and underwent coronary angiogram with stenting of the proximal RCA with Integrity 3.5 x 37m BMS. He was was found to have multiple lymph nodes in his groin and also in the abdomen and has had multiple lymphnodes but has remained stable over the past 2 years and now follwos Dr. BNicholas Losefrom surgical standpoint and scheduled for CT scan in July 2021.  Patient presents for 8 weeks follow up source of cardiac testing.  At last office visit patient was experiencing dyspnea on exertion therefore obtain echocardiogram, nuclear stress test.  He also reported "cardiac arrhythmia" noted by his blood pressure monitor machine at home, therefore ordered 2-week cardiac monitor.  Patient reports he continues to have stable mild dyspnea on exertion.  Otherwise he was out specific complaints today.  States he recently got back from trips to the beach and to the mountains during which she did not have any significant issues.  She does note that he occasionally feels brief episodes of palpitations, particularly when he was at rest and very relaxed, denies associated symptoms.  He also experiences lightheadedness particularly when he gets out of bed to use the restroom at night.  He brings with him home blood pressure recordings which remained soft.  Denies chest pain, syncope, near syncope, leg swelling, orthopnea, PND.  Past Medical History:  Diagnosis Date   AAA (abdominal aortic aneurysm) (HCC)    Atherosclerosis of native coronary artery of [place] heart [place]   Diabetes mellitus without complication (HCC)    H/O  exercise stress test    chets pain, atypical   H/O non-ST elevation myocardial infarction (NSTEMI) 01/23/2019   Hip pain, left    HLD (hyperlipidemia)    HTN (hypertension) 01/23/2019   Hyperlipemia 01/23/2019   Family History  Problem Relation Age of Onset   Lung disease Mother    Heart failure Father    Cancer Maternal Grandmother    Lung disease Maternal Grandfather    Cancer Paternal Grandfather    Past Surgical History:  Procedure Laterality Date   SHOULDER SURGERY     Social History   Tobacco Use   Smoking status: Former    Packs/day: 0.50    Pack years: 0.00    Types: Cigarettes    Quit date: 1989    Years since quitting: 33.4   Smokeless tobacco: Never   Tobacco comments:    started smoking in 7th grade   Substance Use Topics   Alcohol use: Yes    Comment: rarely   Marital Status: Divorced  ROS   Review of Systems  Constitutional: Negative for malaise/fatigue and weight gain.  Cardiovascular:  Positive for dyspnea on exertion and palpitations. Negative for chest pain, claudication, leg swelling, near-syncope, orthopnea, paroxysmal nocturnal dyspnea and syncope.  Respiratory:  Negative for shortness of breath.   Hematologic/Lymphatic: Does not bruise/bleed easily.  Musculoskeletal:  Positive for arthritis, back pain and joint pain.  Gastrointestinal:  Negative for melena.  Neurological:  Positive for light-headedness. Negative for weakness.  Objective:  Blood pressure 129/74, pulse  66, temperature (!) 97.5 F (36.4 C), temperature source Temporal, height '5\' 10"'  (1.778 m), weight 211 lb (95.7 kg), SpO2 96 %. Body mass index is 30.28 kg/m.   Vitals with BMI 01/17/2021 11/22/2020 09/23/2020  Height '5\' 10"'  5' 10.5" 5' 10.5"  Weight 211 lbs 209 lbs 216 lbs  BMI 30.28 67.59 16.38  Systolic 466 599 357  Diastolic 74 64 53  Pulse 66 58 60      Physical Exam Vitals reviewed.  Constitutional:      Appearance: He is well-developed.  HENT:     Head: Normocephalic  and atraumatic.  Cardiovascular:     Rate and Rhythm: Normal rate and regular rhythm.     Pulses: Normal pulses and intact distal pulses.     Heart sounds: Normal heart sounds, S1 normal and S2 normal. No murmur heard.   No gallop.  Pulmonary:     Effort: Pulmonary effort is normal. No accessory muscle usage or respiratory distress.     Breath sounds: Normal breath sounds. No wheezing, rhonchi or rales.  Musculoskeletal:     Right lower leg: No edema.     Left lower leg: No edema.  Neurological:     Mental Status: He is alert.   Laboratory examination:  No flowsheet data found. No flowsheet data found. Lipid Panel  No results found for: CHOL, TRIG, HDL, CHOLHDL, VLDL, LDLCALC, LDLDIRECT HEMOGLOBIN A1C No results found for: HGBA1C, MPG TSH No results for input(s): TSH in the last 8760 hours.  External labs:  01/01/2019: Glucose 139, creatinine 1.12, EGFR 66, potassium 4.7, CMP otherwise normal.  TSH normal.   11/20/2018:  Hemoglobin A1c 6.8%.  Cholesterol 109, triglycerides 146, HDL 27, LDL 53.  CBC normal.  05/22/2018:  CBC normal. Glucose 124, alkaline phos 35, creatinine 0.8, EGFR 90, potassium 4.3, CMP otherwise normal. Hemoglobin A1c 6.7%. Cholesterol 135, triglycerides 69, HDL 44, LDL 77. TSH 3.9.  Allergies   Allergies  Allergen Reactions   Bee Venom Swelling    Wasp/ cause lips and feet to swell      Medications Prior to Visit:   Outpatient Medications Prior to Visit  Medication Sig Dispense Refill   alfuzosin (UROXATRAL) 10 MG 24 hr tablet Take 10 mg by mouth daily.     aspirin EC 81 MG tablet Take 81 mg by mouth daily.     losartan (COZAAR) 25 MG tablet Take 25 mg by mouth daily.     metFORMIN (GLUCOPHAGE-XR) 750 MG 24 hr tablet Take 750 mg by mouth daily.     Multiple Vitamin (MULTIVITAMIN WITH MINERALS) TABS Take 1 tablet by mouth. occasionally     metoprolol succinate (TOPROL-XL) 25 MG 24 hr tablet Take 0.5 tablets (12.5 mg total) by mouth daily. Take  with or immediately following a meal. 30 tablet 3   rosuvastatin (CRESTOR) 40 MG tablet Take 20 mg by mouth daily.     No facility-administered medications prior to visit.     Final Medications at End of Visit    Current Meds  Medication Sig   alfuzosin (UROXATRAL) 10 MG 24 hr tablet Take 10 mg by mouth daily.   aspirin EC 81 MG tablet Take 81 mg by mouth daily.   losartan (COZAAR) 25 MG tablet Take 25 mg by mouth daily.   metFORMIN (GLUCOPHAGE-XR) 750 MG 24 hr tablet Take 750 mg by mouth daily.   metoprolol tartrate (LOPRESSOR) 25 MG tablet Take 0.5 tablets (12.5 mg total) by mouth 2 (two) times daily.  Multiple Vitamin (MULTIVITAMIN WITH MINERALS) TABS Take 1 tablet by mouth. occasionally   [DISCONTINUED] metoprolol succinate (TOPROL-XL) 25 MG 24 hr tablet Take 0.5 tablets (12.5 mg total) by mouth daily. Take with or immediately following a meal.   [DISCONTINUED] rosuvastatin (CRESTOR) 40 MG tablet Take 20 mg by mouth daily.    Radiology:   No results found.  Cardiac Studies:  Ambulatory cardiac telemetry 12/13/2020 - 12/27/2020: Predominant underlying rhythm was sinus with minimum heart rate 50 bpm, maximum heart rate 20 bpm, average heart rate 73 bpm.  Patient triggered events correlated with sinus rhythm and PACs and PVCs. Patient 554 episodes of supraventricular tachycardia longest lasting 6 beats and fastest at a rate of 20 bpm.  Frequent PACs with 8.3% burden, rare PVCs.  Atrial couplets and triplets were rare.  Ventricular bigeminy present.  Lexiscan Tetrofosmin stress test 12/12/2020: Exercise nuclear stress test was performed using Bruce protocol. Patient reached 7 METS, and 84% of age predicted maximum heart rate. Exercise capacity was fair. No chest pain reported. Heart rate and hemodynamic response were normal. Stress EKG revealed no ischemic changes. Normal stress myocardial perfusion. Decreased tracer uptake in inferior myocardium at rest likely due to gut attenuation.   Stress LVEF 75%. Low risk study.  Echocardiogram 12/13/2020:  Left ventricle cavity is normal in size and wall thickness. Normal global  wall motion. Normal LV systolic function with EF 67%. Normal diastolic  filling pattern.  Mild (Grade I) mitral regurgitation.  Mild tricuspid regurgitation.  Mild pulmonic regurgitation.  No evidence of pulmonary hypertension.  Previous study in 2019 noted PASP 35-40 mmHg.   Coronary angiogram 01/20/2016: Normal LVEF, 65%. Proximal RCA S/P 3.5 x 26 mm integrity bare-metal stent implantation. Distal RCA 50% at bifurcation, PDA 80% (small) mid LAD 40% stenosis.  EKG:   09/23/2020: Sinus rhythm at a rate of 60 bpm with 1 PAC.  Left atrial enlargement.  Normal axis. Objective abnormality.  Compared to EKG 10/23/2019, no significant change.   10/23/2019: Normal sinus rhythm with rate of 63 bpm, left atrial enlargement, normal axis.  No evidence of ischemia, normal EKG. no significant change from 01/23/2019.  Assessment:     ICD-10-CM   1. Coronary artery disease of native artery of native heart with stable angina pectoris ([place])  I25.118     2. Dyspnea on exertion  R06.00     3. Essential hypertension  I10     4. PAC (premature atrial contraction)  I49.1     5. SVT (supraventricular tachycardia) (HCC)  I47.1       Meds ordered this encounter  Medications   metoprolol tartrate (LOPRESSOR) 25 MG tablet    Sig: Take 0.5 tablets (12.5 mg total) by mouth 2 (two) times daily.    Dispense:  90 tablet    Refill:  3   rosuvastatin (CRESTOR) 40 MG tablet    Sig: Take 0.5 tablets (20 mg total) by mouth daily.    Dispense:  45 tablet    Refill:  3   Medications Discontinued During This Encounter  Medication Reason   metoprolol succinate (TOPROL-XL) 25 MG 24 hr tablet Change in therapy   rosuvastatin (CRESTOR) 40 MG tablet Reorder    Recommendations:   John Shaffer  is a 75 y.o. male  with CAD, hyperlipidemia, OSA on CPAP, and diabetes. He was in  [place] on 01/17/2016, had NSTEMI and underwent coronary angiogram with stenting of the proximal RCA with Integrity 3.5 x 25m BMS. He was was found  to have multiple lymph nodes in his groin and also in the abdomen and has had multiple lymphnodes but has remained stable over the past 2 years and now follwos Dr. Nicholas Lose from surgical standpoint and scheduled for CT scan in July 2021.  From cardiac standpoint if he needs any surgical intervention for his orthopedic pain, he is stable to undergo this.  Patient presents for 8 weeks follow up source of cardiac testing.  At last office visit patient was experiencing dyspnea on exertion therefore obtain echocardiogram, nuclear stress test.  He also reported "cardiac arrhythmia" noted by his blood pressure monitor machine at home, therefore ordered 2-week cardiac monitor.  Viewed and discussed with patient regarding results of echocardiogram, nuclear stress test, and cardiac monitor, details above.  Echocardiogram was stable compared to 09/18/2017 with normal LVEF and mild valvular disease.  Nuclear stress test was low risk, however patient only reached 84% of age-predicted maximal heart rate.  Ambulatory cardiac telemetry revealed frequent episodes of supraventricular tachycardia as well as 8.3% PAC burden.  We will therefore switch patient from metoprolol succinate to metoprolol titrate 12.5 mg twice daily.  We will plan to reevaluate PAC burden following initiation of metoprolol titrate.  Patient is relatively asymptomatic and echocardiogram is stable, will therefore repeat cardiac monitor prior to next follow-up visit.  Could consider referral to electrophysiology in the future if necessary.  Patient blood pressure does remain soft, and continues to have occasional episodes of positional lightheadedness.  Encourage patient to stay well-hydrated as well as wear support stockings.  Also patient regarding profile of metoprolol titrate, he will notify our office if  he experiences any issues with transition of medication.  Follow-up in 3 months, sooner if needed, for PVCs and supraventricular tachycardia.   Alethia Berthold, PA-C 01/17/2021, 11:19 AM Office: [phone]

## 2021-02-13 ENCOUNTER — Inpatient Hospital Stay: Payer: Medicare Other

## 2021-02-13 ENCOUNTER — Other Ambulatory Visit: Payer: Self-pay

## 2021-02-13 DIAGNOSIS — I491 Atrial premature depolarization: Secondary | ICD-10-CM

## 2021-02-13 DIAGNOSIS — I471 Supraventricular tachycardia: Secondary | ICD-10-CM

## 2021-03-09 NOTE — Progress Notes (Signed)
Please inform patient his amount of PVCs actually increased compared to previous monitor. Please move his appt up to some time in the next 2 weeks to discuss further.

## 2021-03-10 NOTE — Progress Notes (Signed)
Called patient, NA, LMAM

## 2021-03-10 NOTE — Progress Notes (Signed)
Pt called back, pt was informed about her monitor results and pt resch for an earlier appt.

## 2021-03-27 ENCOUNTER — Ambulatory Visit: Payer: Medicare Other | Admitting: Student

## 2021-03-27 ENCOUNTER — Other Ambulatory Visit: Payer: Self-pay

## 2021-03-27 ENCOUNTER — Encounter: Payer: Self-pay | Admitting: Student

## 2021-03-27 VITALS — BP 112/73 | HR 58 | Temp 97.1°F | Wt 207.0 lb

## 2021-03-27 DIAGNOSIS — I491 Atrial premature depolarization: Secondary | ICD-10-CM

## 2021-03-27 DIAGNOSIS — R5383 Other fatigue: Secondary | ICD-10-CM

## 2021-03-27 DIAGNOSIS — I471 Supraventricular tachycardia, unspecified: Secondary | ICD-10-CM

## 2021-03-27 NOTE — Progress Notes (Signed)
Primary Physician:  Jani Gravel, MD   Patient ID: John Shaffer, male    DOB: 06/23/1946, 75 y.o.   MRN: [id number]  Subjective:   Chief Complaint  Patient presents with   Coronary Artery Disease   Results   PAC    HPI: John Shaffer  is a 75 y.o. male  with CAD, hyperlipidemia, OSA on CPAP, and diabetes. He was in [place] on 01/17/2016, had NSTEMI and underwent coronary angiogram with stenting of the proximal RCA with Integrity 3.5 x 74m BMS. He was was found to have multiple lymph nodes in his groin and also in the abdomen and has had multiple lymphnodes but has remained stable over the past 2 years and now follwos Dr. BNicholas Losefrom surgical standpoint and scheduled for CT scan in July 2021.  Patient presents for 328-monthollow-up for PVCs and SVT.  At last visit switch patient from metoprolol succinate to metoprolol to tartrate 12.5 mg twice daily.  Repeat cardiac monitor revealed PAC burden remains high at 11.8%.  Patient's blood pressure also remains soft.  He continues to complain of dizziness and fatigue, although he is able to walk in the park every morning and 2 physical yard work.  Denies chest pain, palpitations syncope, near syncope.  Past Medical History:  Diagnosis Date   AAA (abdominal aortic aneurysm) (HCC)    Atherosclerosis of native coronary artery of [place] heart [place]   Diabetes mellitus without complication (HCC)    H/O exercise stress test    chets pain, atypical   H/O non-ST elevation myocardial infarction (NSTEMI) 01/23/2019   Hip pain, left    HLD (hyperlipidemia)    HTN (hypertension) 01/23/2019   Hyperlipemia 01/23/2019   Family History  Problem Relation Age of Onset   Lung disease Mother    Heart failure Father    Cancer Maternal Grandmother    Lung disease Maternal Grandfather    Cancer Paternal Grandfather    Past Surgical History:  Procedure Laterality Date   SHOULDER SURGERY     Social History    Tobacco Use   Smoking status: Former    Packs/day: 0.50    Types: Cigarettes    Quit date: 1989    Years since quitting: 33.6   Smokeless tobacco: Never   Tobacco comments:    started smoking in 7th grade   Substance Use Topics   Alcohol use: Yes    Comment: rarely   Marital Status: Divorced  ROS   Review of Systems  Constitutional: Positive for malaise/fatigue. Negative for weight gain.  Cardiovascular:  Positive for dyspnea on exertion. Negative for chest pain, claudication, leg swelling, near-syncope, orthopnea, palpitations, paroxysmal nocturnal dyspnea and syncope.  Respiratory:  Negative for shortness of breath.   Hematologic/Lymphatic: Does not bruise/bleed easily.  Musculoskeletal:  Positive for arthritis, back pain and joint pain.  Gastrointestinal:  Negative for melena.  Neurological:  Positive for dizziness. Negative for weakness.  Objective:  Blood pressure 112/73, pulse (!) 58, temperature (!) 97.1 F (36.2 C), weight 207 lb (93.9 kg), SpO2 96 %. Body mass index is 29.7 kg/m.   Vitals with BMI 03/27/2021 01/17/2021 11/22/2020  Height - '5\' 10"'  5' 10.5"  Weight 207 lbs 211 lbs 209 lbs  BMI - 3002.77941.28Systolic [id number] 73 74 64  Pulse 58 66 58      Physical Exam Vitals reviewed.  Constitutional:      Appearance: He is well-developed.  Cardiovascular:     Rate and Rhythm: Normal rate and regular rhythm. Occasional Extrasystoles are present.    Pulses: Normal pulses and intact distal pulses.     Heart sounds: Normal heart sounds, S1 normal and S2 normal. No murmur heard.   No gallop.  Pulmonary:     Effort: Pulmonary effort is normal. No accessory muscle usage or respiratory distress.     Breath sounds: Normal breath sounds. No wheezing, rhonchi or rales.  Musculoskeletal:     Right lower leg: No edema.     Left lower leg: No edema.  Neurological:     Mental Status: He is alert.   Laboratory examination:  No flowsheet data found. No  flowsheet data found. Lipid Panel  No results found for: CHOL, TRIG, HDL, CHOLHDL, VLDL, LDLCALC, LDLDIRECT HEMOGLOBIN A1C No results found for: HGBA1C, MPG TSH No results for input(s): TSH in the last 8760 hours.  External labs:  01/01/2019: Glucose 139, creatinine 1.12, EGFR 66, potassium 4.7, CMP otherwise normal.  TSH normal.   11/20/2018:  Hemoglobin A1c 6.8%.  Cholesterol 109, triglycerides 146, HDL 27, LDL 53.  CBC normal.  05/22/2018:  CBC normal. Glucose 124, alkaline phos 35, creatinine 0.8, EGFR 90, potassium 4.3, CMP otherwise normal. Hemoglobin A1c 6.7%. Cholesterol 135, triglycerides 69, HDL 44, LDL 77. TSH 3.9.  Allergies   Allergies  Allergen Reactions   Bee Pollen Anaphylaxis    Other reaction(s): anaphylaxis   Bee Venom Anaphylaxis and Swelling    Wasp/ cause lips and feet to swell      Medications Prior to Visit:   Outpatient Medications Prior to Visit  Medication Sig Dispense Refill   alfuzosin (UROXATRAL) 10 MG 24 hr tablet Take 10 mg by mouth daily.     aspirin EC 81 MG tablet Take 81 mg by mouth daily.     losartan (COZAAR) 25 MG tablet Take 25 mg by mouth daily.     metFORMIN (GLUCOPHAGE-XR) 750 MG 24 hr tablet Take 750 mg by mouth daily.     metoprolol tartrate (LOPRESSOR) 25 MG tablet Take 0.5 tablets (12.5 mg total) by mouth 2 (two) times daily. 90 tablet 3   Multiple Vitamin (MULTIVITAMIN WITH MINERALS) TABS Take 1 tablet by mouth. occasionally     rosuvastatin (CRESTOR) 40 MG tablet Take 0.5 tablets (20 mg total) by mouth daily. 45 tablet 3   No facility-administered medications prior to visit.     Final Medications at End of Visit    Current Meds  Medication Sig   alfuzosin (UROXATRAL) 10 MG 24 hr tablet Take 10 mg by mouth daily.   aspirin EC 81 MG tablet Take 81 mg by mouth daily.   losartan (COZAAR) 25 MG tablet Take 25 mg by mouth daily.   metFORMIN (GLUCOPHAGE-XR) 750 MG 24 hr tablet Take 750 mg by mouth daily.   metoprolol  tartrate (LOPRESSOR) 25 MG tablet Take 0.5 tablets (12.5 mg total) by mouth 2 (two) times daily.   Multiple Vitamin (MULTIVITAMIN WITH MINERALS) TABS Take 1 tablet by mouth. occasionally   rosuvastatin (CRESTOR) 40 MG tablet Take 0.5 tablets (20 mg total) by mouth daily.    Radiology:   No results found.  Cardiac Studies:  Ambulatory cardiac telemetry 14 days 02/13/2021 - 02/27/2021: Predominant underlying rhythm was sinus.  Minimum heart rate 45 bpm, maximum heart rate 27 bpm, average heart rate 70 bpm.  2 episodes of nonsustained ventricular tachycardia each lasting 4 beats.  >1900 supraventricular tachycardia episodes, some of  which were suggestive of atrial tachycardia.  Longest episode lasting 22.2 seconds with average rate of 114 bpm.  Idioventricular rhythm at a rate of 66 bpm was present.  Frequent PACs (11.8% burden isolated PACs) and rare PVCs.  Ventricular bigeminy and trigeminy present.  Patient's symptoms correlated with normal sinus rhythm as well as PACs and PVCs.  Ambulatory cardiac telemetry 12/13/2020 - 12/27/2020: Predominant underlying rhythm was sinus with minimum heart rate 50 bpm, maximum heart rate 20 bpm, average heart rate 73 bpm.  Patient triggered events correlated with sinus rhythm and PACs and PVCs. Patient 554 episodes of supraventricular tachycardia longest lasting 6 beats and fastest at a rate of 20 bpm.  Frequent PACs with 8.3% burden, rare PVCs.  Atrial couplets and triplets were rare.  Ventricular bigeminy present.  Lexiscan Tetrofosmin stress test 12/12/2020: Exercise nuclear stress test was performed using Bruce protocol. Patient reached 7 METS, and 84% of age predicted maximum heart rate. Exercise capacity was fair. No chest pain reported. Heart rate and hemodynamic response were normal. Stress EKG revealed no ischemic changes. Normal stress myocardial perfusion. Decreased tracer uptake in inferior myocardium at rest likely due to gut attenuation.  Stress LVEF  75%. Low risk study.  Echocardiogram 12/13/2020:  Left ventricle cavity is normal in size and wall thickness. Normal global  wall motion. Normal LV systolic function with EF 67%. Normal diastolic  filling pattern.  Mild (Grade I) mitral regurgitation.  Mild tricuspid regurgitation.  Mild pulmonic regurgitation.  No evidence of pulmonary hypertension.  Previous study in 2019 noted PASP 35-40 mmHg.   Coronary angiogram 01/20/2016: Normal LVEF, 65%. Proximal RCA S/P 3.5 x 26 mm integrity bare-metal stent implantation. Distal RCA 50% at bifurcation, PDA 80% (small) mid LAD 40% stenosis.  EKG:   09/23/2020: Sinus rhythm at a rate of 60 bpm with 1 PAC.  Left atrial enlargement.  Normal axis. Objective abnormality.  Compared to EKG 10/23/2019, no significant change.   10/23/2019: Normal sinus rhythm with rate of 63 bpm, left atrial enlargement, normal axis.  No evidence of ischemia, normal EKG. no significant change from 01/23/2019.  Assessment:     ICD-10-CM   1. PAC (premature atrial contraction)  I49.1 Ambulatory referral to Cardiac Electrophysiology    2. SVT (supraventricular tachycardia) (HCC)  I47.1       No orders of the defined types were placed in this encounter.  There are no discontinued medications.   Recommendations:   John Shaffer  is a 75 y.o. male  with CAD, hyperlipidemia, OSA on CPAP, and diabetes. He was in [place] on 01/17/2016, had NSTEMI and underwent coronary angiogram with stenting of the proximal RCA with Integrity 3.5 x 27m BMS. He was was found to have multiple lymph nodes in his groin and also in the abdomen and has had multiple lymphnodes but has remained stable over the past 2 years and now follwos Dr. BNicholas Losefrom surgical standpoint and scheduled for CT scan in July 2021.  Patient presents for 3107-monthollow-up for PACs and SVT.  At last visit switch patient from metoprolol succinate to metoprolol to tartrate 12.5 mg twice daily.  Unfortunately  patient's PVC burden remains high, reviewed cardiac monitor results as detailed above.  Patient's blood pressure remains soft and he continues to have symptoms of fatigue and dizziness, will refer to cardiac electrophysiology for further recommendations regarding management of PACs.   Shared decision was to continue metoprolol titrate 12.5 mg twice daily given patient's history of coronary artery disease.  Encourage liberal hydration and conservative measures regarding dizziness and fatigue.  No changes were made to patient's medication at this time.  Follow-up in 3 months, sooner if needed, for PACs, CAD.  Appreciate EP recommendations.   John Berthold, PA-C 03/27/2021, 11:04 AM Office: [phone]

## 2021-04-20 ENCOUNTER — Ambulatory Visit: Payer: Medicare Other | Admitting: Student

## 2021-05-10 ENCOUNTER — Encounter: Payer: Self-pay | Admitting: Cardiology

## 2021-05-10 ENCOUNTER — Ambulatory Visit: Payer: Medicare Other | Admitting: Cardiology

## 2021-05-10 ENCOUNTER — Other Ambulatory Visit: Payer: Self-pay

## 2021-05-10 VITALS — BP 124/68 | HR 64 | Ht 70.5 in | Wt 202.8 lb

## 2021-05-10 DIAGNOSIS — I10 Essential (primary) hypertension: Secondary | ICD-10-CM

## 2021-05-10 DIAGNOSIS — I491 Atrial premature depolarization: Secondary | ICD-10-CM

## 2021-05-10 DIAGNOSIS — I25118 Atherosclerotic heart disease of native coronary artery with other forms of angina pectoris: Secondary | ICD-10-CM | POA: Diagnosis not present

## 2021-05-10 MED ORDER — LOSARTAN POTASSIUM 25 MG PO TABS: 12.5000 mg | ORAL_TABLET | Freq: Every day | ORAL | 3 refills | Status: DC

## 2021-05-10 MED ORDER — METOPROLOL SUCCINATE ER 25 MG PO TB24: 25.0000 mg | ORAL_TABLET | Freq: Every day | ORAL | 3 refills | Status: DC

## 2021-05-10 NOTE — Progress Notes (Signed)
Electrophysiology Office Note:    Date:  05/10/2021   ID:  John Shaffer, DOB 04-03-1946, MRN [id number]  PCP:  John Grippe, MD  [facility] Cardiologist:  None  [facility] Electrophysiologist:  John Prude, MD   Referring MD: John Halsted, PA*   Chief Complaint: PACs  History of Present Illness:    John Shaffer is a 75 y.o. male who presents for an evaluation of PACs at the request of John So, PA-C. Their medical history includes coronary artery disease, hyperlipidemia, obstructive sleep apnea on CPAP and diabetes.  The patient receives a lot of his medical care in [facility].  He has a history of coronary disease and underwent PCI to the RCA in 2017 after an NSTEMI.  The patient has a long history of palpitations.  He also reports nearly constant fatigue and decreased exercise tolerance.  He is worn a monitor in the past which showed a relatively high PAC burden.  From his history it seems like some of his fatigue corresponds to the timing of starting some of his hypertension medications.  He tells me he checks his blood pressures at home and sometimes they have run quite low, systolics less than 100 mmHg.   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   aspirin EC 81 MG tablet Take 81 mg by mouth daily.   losartan (COZAAR) 25 MG tablet Take 0.5  tablets (12.5 mg total) by mouth daily.   metFORMIN (GLUCOPHAGE-XR) 750 MG 24 hr tablet Take 750 mg by mouth daily.   metoprolol succinate (TOPROL XL) 25 MG 24 hr tablet Take 1 tablet (25 mg total) by mouth daily.   Multiple Vitamin (MULTIVITAMIN WITH MINERALS) TABS Take 1 tablet by mouth. occasionally   rosuvastatin (CRESTOR) 40 MG tablet Take 0.5 tablets (20 mg total) by mouth daily.   [DISCONTINUED] losartan (COZAAR) 25 MG tablet Take 25 mg by mouth daily.   [DISCONTINUED] metoprolol tartrate (LOPRESSOR) 25 MG tablet Take 0.5 tablets (12.5 mg total) by mouth 2 (two) times daily.     Allergies:   Bee pollen and Bee venom   Social History   Socioeconomic History   Marital status: Divorced    Spouse name: Not on file   Number of children: 1   Years of education: Not on file   Highest education level: Not on file  Occupational History   Not on file  Tobacco Use   Smoking status: Former    Packs/day: 0.50    Types: Cigarettes    Quit date: 10    Years since quitting: 33.8   Smokeless tobacco: Never   Tobacco comments:    started smoking in 7th grade   Vaping Use   Vaping Use: Never used  Substance and Sexual Activity   Alcohol use: Yes    Comment: rarely  Drug use: No   Sexual activity: Not on file  Other Topics Concern   Not on file  Social History Narrative   Not on file   Social Determinants of Health   Financial Resource Strain: Not on file  Food Insecurity: Not on file  Transportation Needs: Not on file  Physical Activity: Not on file  Stress: Not on file  Social Connections: Not on file     Family History: The patient's family history includes Cancer in his maternal grandmother and paternal grandfather; Heart failure in his father; Lung disease in his maternal grandfather and mother.  ROS:   Please see the history of present illness.    All other systems reviewed and are negative.  EKGs/Labs/Other Studies Reviewed:    The following studies were  reviewed today:  March 02, 2021 ZIO personally reviewed Longest SVT, 22 seconds.  Rhythm strips suggestive of atrial tachycardia 11.8% burden of PACs Rare PVCs Patient symptom events correlate to sinus rhythm and sinus rhythm with PACs.  EKG:  The ekg ordered today demonstrates Sinus rhythm with a PAC   Recent Labs: No results found for requested labs within last 8760 hours.  Recent Lipid Panel No results found for: CHOL, TRIG, HDL, CHOLHDL, VLDL, LDLCALC, LDLDIRECT  Physical Exam:    VS:  BP 124/68   Pulse 64   Ht 5' 10.5" (1.791 m)   Wt 202 lb 12.8 oz (92 kg)   SpO2 98%   BMI 28.69 kg/m     Wt Readings from Last 3 Encounters:  05/10/21 202 lb 12.8 oz (92 kg)  03/27/21 207 lb (93.9 kg)  01/17/21 211 lb (95.7 kg)     GEN:  Well nourished, well developed in no acute distress HEENT: Normal NECK: No JVD; No carotid bruits LYMPHATICS: No lymphadenopathy CARDIAC: RRR, no murmurs, rubs, gallops RESPIRATORY:  Clear to auscultation without rales, wheezing or rhonchi  ABDOMEN: Soft, non-tender, non-distended MUSCULOSKELETAL:  No edema; No deformity  SKIN: Warm and dry NEUROLOGIC:  Alert and oriented x 3 PSYCHIATRIC:  Normal affect       ASSESSMENT:    1. PAC (premature atrial contraction)   2. Coronary artery disease of native artery of native heart with stable angina pectoris (HCC)   3. Essential hypertension    PLAN:    In order of problems listed above:  #PAC High burden on recent ZIO monitor.  We discussed the treatment options for his PACs including continued beta-blockers versus antiarrhythmic drug therapy.  At this time I am not convinced that his symptoms correspond to PACs and not relatively low blood pressures.  I do not think the degree of symptoms he is experiencing warrants use of an antiarrhythmic drug.  His history of coronary artery disease precludes safe use of a class Ic which would be the most ideal initial option for PACs.  We discussed the above  at length and he is agreeable to continue on metoprolol.  I will change him back to metoprolol succinate 25 mg by mouth once daily at night.  #Coronary artery disease Continue aspirin and statin  #Hypertension Blood pressures have been running low at home and he is symptomatic with this.  We will cut his losartan today to 12.5 mg by mouth once daily and he will continue to monitor his pressures.  Goal systolic blood pressure for him is 130 mmHg.  If he is consistently less than 130 mmHg, would favor discontinuing the losartan altogether.   Follow-up as needed.  Medication Adjustments/Labs and Tests Ordered: Current medicines are reviewed at length with the patient today.  Concerns regarding medicines are outlined above.  Orders Placed This Encounter  Procedures   EKG 12-Lead   Meds ordered this encounter  Medications   losartan (COZAAR) 25 MG tablet    Sig: Take 0.5 tablets (12.5 mg total) by mouth daily.    Dispense:  45 tablet    Refill:  3   metoprolol succinate (TOPROL XL) 25 MG 24 hr tablet    Sig: Take 1 tablet (25 mg total) by mouth daily.    Dispense:  90 tablet    Refill:  3     Signed, Sheria Lang T. Lalla Brothers, MD, [facility], [facility] 05/10/2021 8:58 PM    Electrophysiology [facility]

## 2021-05-10 NOTE — Patient Instructions (Addendum)
Medication Instructions:  Your physician has recommended you make the following change in your medication:    STOP metoprolol tartrate  2.    START taking metoprolol succinate 25 mg- Take one tablet by mouth daily  3.    REDUCE your losartan 25 mg-  Take 1/2 tablet (12.5 mg) by mouth once a day  *If you need a refill on your cardiac medications before your next appointment, please call your pharmacy*  Lab Work: None ordered. If you have labs (blood work) drawn today and your tests are completely normal, you will receive your results only by: MyChart Message (if you have MyChart) OR A paper copy in the mail If you have any lab test that is abnormal or we need to change your treatment, we will call you to review the results.  Testing/Procedures: None ordered.  Follow-Up: At [facility], you and your health needs are our priority.  As part of our continuing mission to provide you with exceptional heart care, we have created designated Provider Care Teams.  These Care Teams include your primary Cardiologist (physician) and Advanced Practice Providers (APPs -  Physician Assistants and Nurse Practitioners) who all work together to provide you with the care you need, when you need it.  Your next appointment:   Your physician wants you to follow-up in: as needed with Lanier Prude, MD

## 2021-06-27 ENCOUNTER — Ambulatory Visit: Payer: Medicare Other | Admitting: Student

## 2021-07-10 ENCOUNTER — Encounter: Payer: Self-pay | Admitting: Student

## 2021-07-10 ENCOUNTER — Other Ambulatory Visit: Payer: Self-pay

## 2021-07-10 ENCOUNTER — Ambulatory Visit: Payer: Medicare Other | Admitting: Student

## 2021-07-10 VITALS — BP 119/67 | HR 61 | Temp 98.2°F | Resp 16 | Ht 70.0 in | Wt 207.0 lb

## 2021-07-10 DIAGNOSIS — I25118 Atherosclerotic heart disease of native coronary artery with other forms of angina pectoris: Secondary | ICD-10-CM

## 2021-07-10 DIAGNOSIS — I491 Atrial premature depolarization: Secondary | ICD-10-CM

## 2021-07-10 NOTE — Progress Notes (Signed)
Primary Physician:  John Gravel, MD   Patient ID: John Shaffer, male    DOB: August 24, 1945, 75 y.o.   MRN: [id number]  Subjective:   Chief Complaint  Patient presents with   [facility]   Coronary Artery Disease   Follow-up    3 month    HPI: John Shaffer  is a 75 y.o. male  with CAD, hyperlipidemia, OSA on CPAP, and diabetes. He was in [place] on 01/17/2016, had NSTEMI and underwent coronary angiogram with stenting of the proximal RCA with Integrity 3.5 x 65m BMS. He was was found to have multiple lymph nodes in his groin and also in the abdomen and has had multiple lymphnodes but has remained stable over the past 2 years and now follwos Dr. BNicholas Losefrom surgical standpoint and scheduled for CT scan in July 2021.  Patient presents for 373-monthollow-up of PACs and CAD.  Last office referred patient to EP for further evaluation of PACs.  Dr. LaQuentin Oreecommended patient continue Toprol-XL 25 mg once daily and reduce losartan to 12.5 mg daily, feeling patient's symptoms related to soft blood pressures rather than PACs.  Following medication changes patient's symptoms of dyspnea have resolved.  He has had no recurrence of palpitations.  Denies chest pain, syncope, near syncope.  Patient has been able to increase his physical activity without issue, however he does admit to poor dietary compliance as of late.  Past Medical History:  Diagnosis Date   AAA (abdominal aortic aneurysm)    Atherosclerosis of native coronary artery of [place] heart [place]is [place]   Diabetes mellitus without complication (HCC)    H/O exercise stress test    chets pain, atypical   H/O non-ST elevation myocardial infarction (NSTEMI) 01/23/2019   Hip pain, left    HLD (hyperlipidemia)    HTN (hypertension) 01/23/2019   Hyperlipemia 01/23/2019   Family History  Problem Relation Age of Onset   Lung disease Mother    Heart failure Father    Cancer Maternal Grandmother    Lung disease Maternal  Grandfather    Cancer Paternal Grandfather    Past Surgical History:  Procedure Laterality Date   SHOULDER SURGERY     Social History   Tobacco Use   Smoking status: Former    Packs/day: 0.50    Years: 10.00    Pack years: 5.00    Types: Cigarettes    Quit date: 1989    Years since quitting: 33.9   Smokeless tobacco: Never   Tobacco comments:    started smoking in 7th grade   Substance Use Topics   Alcohol use: Yes    Comment: rarely   Marital Status: Divorced  ROS   Review of Systems  Constitutional: Negative for malaise/fatigue (resolved) and weight gain.  Cardiovascular:  Negative for chest pain, claudication, dyspnea on exertion (resolved), leg swelling, near-syncope, orthopnea, palpitations, paroxysmal nocturnal dyspnea and syncope.  Respiratory:  Negative for shortness of breath.   Hematologic/Lymphatic: Does not bruise/bleed easily.  Musculoskeletal:  Positive for arthritis, back pain and joint pain.  Gastrointestinal:  Negative for melena.  Neurological:  Negative for dizziness and weakness.  Objective:  Blood pressure 119/67, pulse 61, temperature 98.2 F (36.8 C), resp. rate 16, height '5\' 10"'  (1.778 m), weight 207 lb (93.9 kg), SpO2 97 %. Body mass index is 29.7 kg/m.   Vitals with BMI 07/10/2021 05/10/2021 03/27/2021  Height '5\' 10"'  5' 10.5" -  Weight 207 lbs 202 lbs 13 oz 207  lbs  BMI 73.4 19.37 -  Systolic 902 409 735  Diastolic 67 68 73  Pulse 61 64 58      Physical Exam Vitals reviewed.  Constitutional:      Appearance: He is well-developed.  Cardiovascular:     Rate and Rhythm: Normal rate and regular rhythm. Occasional Extrasystoles are present.    Pulses: Normal pulses and intact distal pulses.     Heart sounds: Normal heart sounds, S1 normal and S2 normal. No murmur heard.   No gallop.  Pulmonary:     Effort: Pulmonary effort is normal. No accessory muscle usage or respiratory distress.     Breath sounds: Normal breath sounds. No wheezing,  rhonchi or rales.  Musculoskeletal:     Right lower leg: No edema.     Left lower leg: No edema.  Neurological:     Mental Status: He is alert.  Physical exam unchanged compared to previous office visit.  Laboratory examination:  No flowsheet data found. No flowsheet data found. Lipid Panel  No results found for: CHOL, TRIG, HDL, CHOLHDL, VLDL, LDLCALC, LDLDIRECT HEMOGLOBIN A1C No results found for: HGBA1C, MPG TSH No results for input(s): TSH in the last 8760 hours.  External labs:  01/01/2019: Glucose 139, creatinine 1.12, EGFR 66, potassium 4.7, CMP otherwise normal.  TSH normal.   11/20/2018:  Hemoglobin A1c 6.8%.  Cholesterol 109, triglycerides 146, HDL 27, LDL 53.  CBC normal.  05/22/2018:  CBC normal. Glucose 124, alkaline phos 35, creatinine 0.8, EGFR 90, potassium 4.3, CMP otherwise normal. Hemoglobin A1c 6.7%. Cholesterol 135, triglycerides 69, HDL 44, LDL 77. TSH 3.9.  Allergies   Allergies  Allergen Reactions   Bee Pollen Anaphylaxis    Other reaction(s): anaphylaxis   Bee Venom Anaphylaxis and Swelling    Wasp/ cause lips and feet to swell    Medications Prior to Visit:   Outpatient Medications Prior to Visit  Medication Sig Dispense Refill   alfuzosin (UROXATRAL) 10 MG 24 hr tablet Take 10 mg by mouth daily.     aspirin EC 81 MG tablet Take 81 mg by mouth daily.     losartan (COZAAR) 25 MG tablet Take 0.5 tablets (12.5 mg total) by mouth daily. 45 tablet 3   metFORMIN (GLUCOPHAGE-XR) 750 MG 24 hr tablet Take 750 mg by mouth daily.     metoprolol succinate (TOPROL XL) 25 MG 24 hr tablet Take 1 tablet (25 mg total) by mouth daily. 90 tablet 3   Multiple Vitamin (MULTIVITAMIN WITH MINERALS) TABS Take 1 tablet by mouth. occasionally     rosuvastatin (CRESTOR) 40 MG tablet Take 0.5 tablets (20 mg total) by mouth daily. 45 tablet 3   No facility-administered medications prior to visit.   Final Medications at End of Visit    Current Meds  Medication Sig    alfuzosin (UROXATRAL) 10 MG 24 hr tablet Take 10 mg by mouth daily.   aspirin EC 81 MG tablet Take 81 mg by mouth daily.   losartan (COZAAR) 25 MG tablet Take 0.5 tablets (12.5 mg total) by mouth daily.   metFORMIN (GLUCOPHAGE-XR) 750 MG 24 hr tablet Take 750 mg by mouth daily.   metoprolol succinate (TOPROL XL) 25 MG 24 hr tablet Take 1 tablet (25 mg total) by mouth daily.   Multiple Vitamin (MULTIVITAMIN WITH MINERALS) TABS Take 1 tablet by mouth. occasionally   rosuvastatin (CRESTOR) 40 MG tablet Take 0.5 tablets (20 mg total) by mouth daily.   Radiology:   No results  found.  Cardiac Studies:  Ambulatory cardiac telemetry 14 days 02/13/2021 - 02/27/2021: Predominant underlying rhythm was sinus.  Minimum heart rate 45 bpm, maximum heart rate 27 bpm, average heart rate 70 bpm.  2 episodes of nonsustained ventricular tachycardia each lasting 4 beats.  >1900 supraventricular tachycardia episodes, some of which were suggestive of atrial tachycardia.  Longest episode lasting 22.2 seconds with average rate of 114 bpm.  Idioventricular rhythm at a rate of 66 bpm was present.  Frequent PACs (11.8% burden isolated PACs) and rare PVCs.  Ventricular bigeminy and trigeminy present.  Patient's symptoms correlated with normal sinus rhythm as well as PACs and PVCs.  Ambulatory cardiac telemetry 12/13/2020 - 12/27/2020: Predominant underlying rhythm was sinus with minimum heart rate 50 bpm, maximum heart rate 20 bpm, average heart rate 73 bpm.  Patient triggered events correlated with sinus rhythm and PACs and PVCs. Patient 554 episodes of supraventricular tachycardia longest lasting 6 beats and fastest at a rate of 20 bpm.  Frequent PACs with 8.3% burden, rare PVCs.  Atrial couplets and triplets were rare.  Ventricular bigeminy present.  Lexiscan Tetrofosmin stress test 12/12/2020: Exercise nuclear stress test was performed using Bruce protocol. Patient reached 7 METS, and 84% of age predicted maximum heart rate.  Exercise capacity was fair. No chest pain reported. Heart rate and hemodynamic response were normal. Stress EKG revealed no ischemic changes. Normal stress myocardial perfusion. Decreased tracer uptake in inferior myocardium at rest likely due to gut attenuation.  Stress LVEF 75%. Low risk study.  Echocardiogram 12/13/2020:  Left ventricle cavity is normal in size and wall thickness. Normal global  wall motion. Normal LV systolic function with EF 67%. Normal diastolic  filling pattern.  Mild (Grade I) mitral regurgitation.  Mild tricuspid regurgitation.  Mild pulmonic regurgitation.  No evidence of pulmonary hypertension.  Previous study in 2019 noted PASP 35-40 mmHg.   Coronary angiogram 01/20/2016: Normal LVEF, 65%. Proximal RCA S/P 3.5 x 26 mm integrity bare-metal stent implantation. Distal RCA 50% at bifurcation, PDA 80% (small) mid LAD 40% stenosis.  EKG:   09/23/2020: Sinus rhythm at a rate of 60 bpm with 1 PAC.  Left atrial enlargement.  Normal axis. Objective abnormality.  Compared to EKG 10/23/2019, no significant change.   10/23/2019: Normal sinus rhythm with rate of 63 bpm, left atrial enlargement, normal axis.  No evidence of ischemia, normal EKG. no significant change from 01/23/2019.  Assessment:     ICD-10-CM   1. PAC (premature atrial contraction)  I49.1     2. Coronary artery disease of native artery of native heart with stable angina pectoris ([place])  I25.118       No orders of the defined types were placed in this encounter.  There are no discontinued medications.   Recommendations:   IRELAND CHAGNON  is a 75 y.o. male  with CAD, hyperlipidemia, OSA on CPAP, and diabetes. He was in [place] on 01/17/2016, had NSTEMI and underwent coronary angiogram with stenting of the proximal RCA with Integrity 3.5 x 30m BMS. He was was found to have multiple lymph nodes in his groin and also in the abdomen and has had multiple lymphnodes but has remained stable over the past 2  years and now follwos Dr. BNicholas Losefrom surgical standpoint and scheduled for CT scan in July 2021.  Patient presents for 363-monthollow-up of PACs and CAD.  Last office referred patient to EP for further evaluation of PACs.  Dr. LaQuentin Oreecommended patient continue Toprol-XL 25 mg once  daily and reduce losartan to 12.5 mg daily, feeling patient's symptoms related to soft blood pressures rather than PACs.  Patient's symptoms of fatigue and dyspnea have resolved.  Blood pressure is stable and well-controlled.  He continues to have PACs on exam, however he is now essentially asymptomatic.  Will not make changes to medications at this time.  Follow-up in 1 year, sooner if needed, for CAD, hyperlipidemia, PACs.  [facility]    Alethia Berthold, PA-C 07/10/2021, 11:21 AM Office: [phone]

## 2021-10-05 IMAGING — CT CT BIOPSY
1 of 4 series · 9 of 32 positions shown, 15 images · non-contrast
Comparison: none

CLINICAL DATA: Left low back, buttock, and leg pain. Suspected
sacroiliac joint dysfunction.

[Series 2: needle -guided injection · axial · 0.92mm/px · z∈[+819,+889]mm · 9 of 45 slices shown, 15 images]
[im 5/45  soft-tissue]
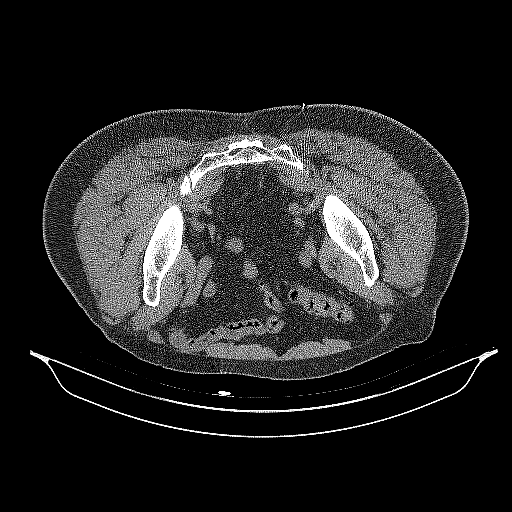
[im 5/45  bone]
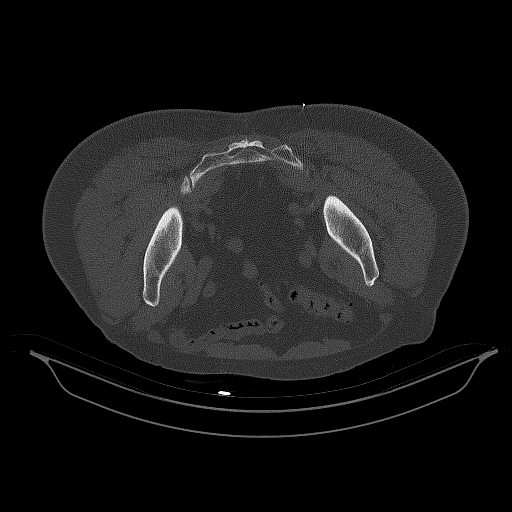
[im 9/45  soft-tissue]
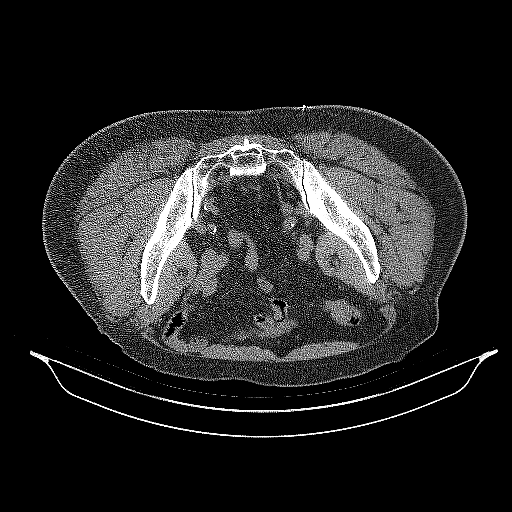
[im 14/45  soft-tissue]
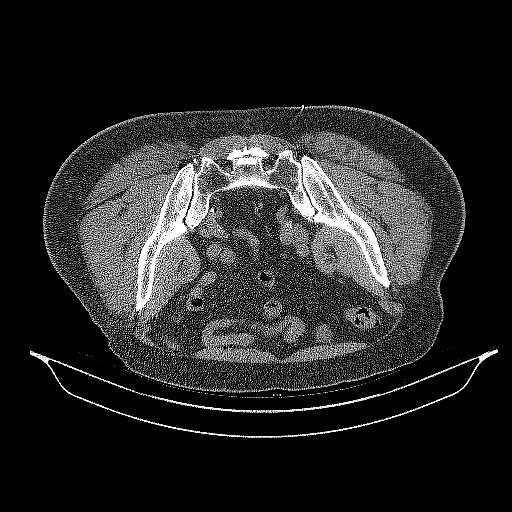
[im 18/45  soft-tissue]
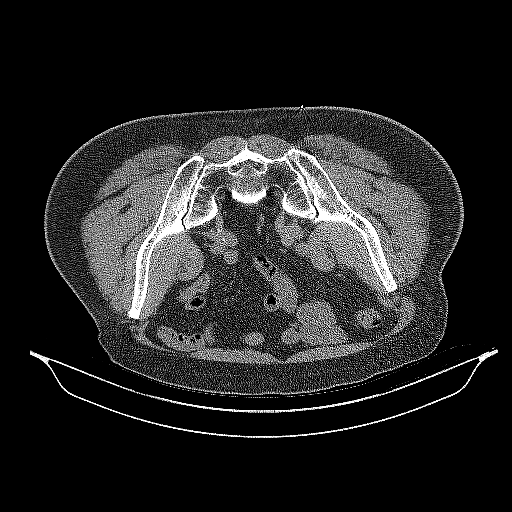
[im 23/45  soft-tissue]
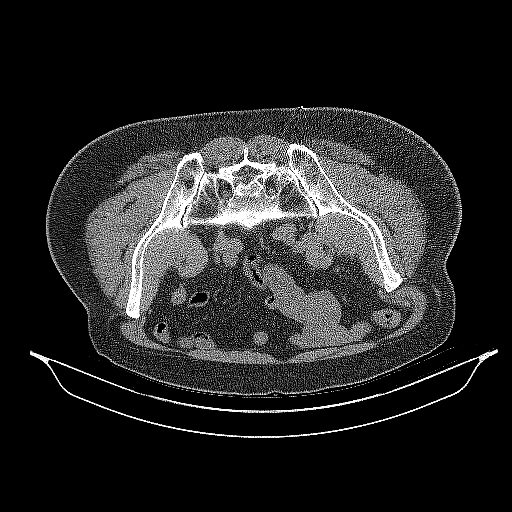
[im 27/45  soft-tissue]
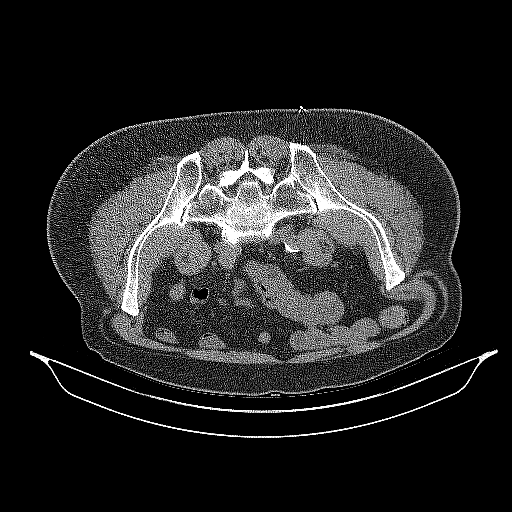
[im 27/45  lung]
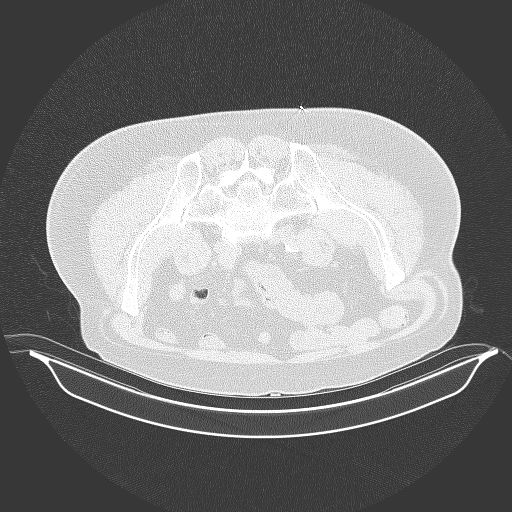
[im 31/45  soft-tissue]
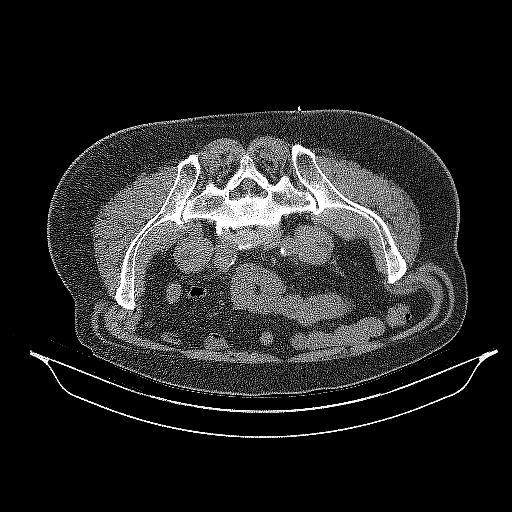
[im 31/45  lung]
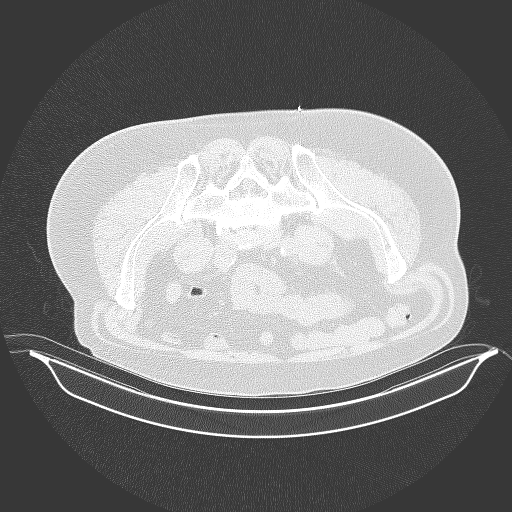
[im 36/45  soft-tissue]
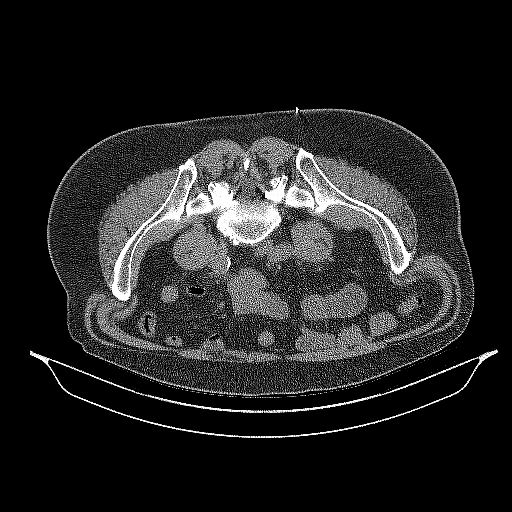
[im 36/45  lung]
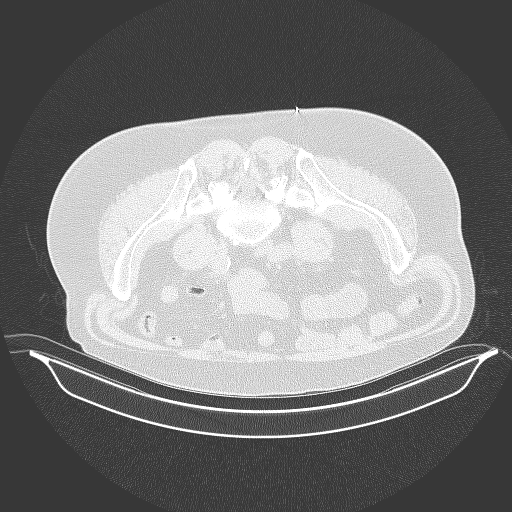
[im 40/45  soft-tissue]
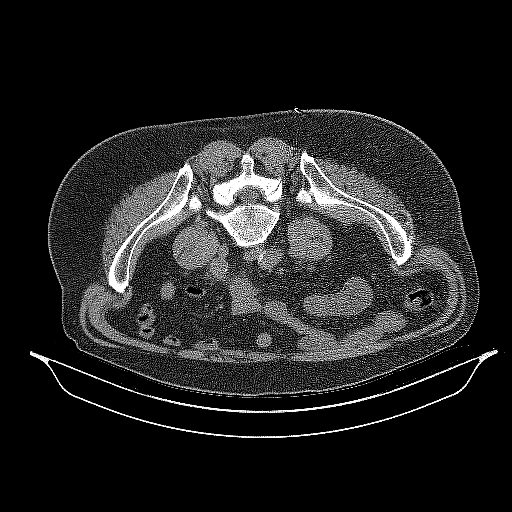
[im 40/45  lung]
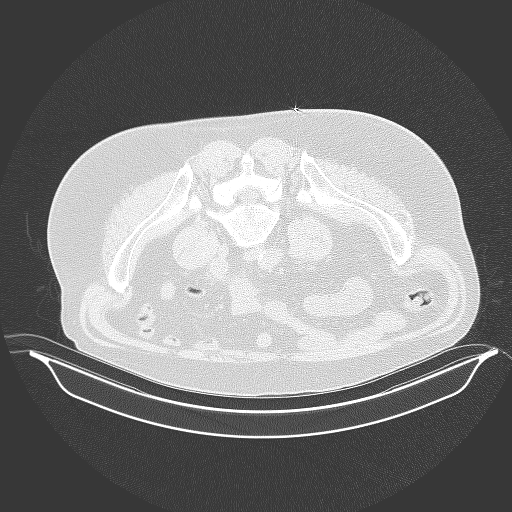
[im 40/45  bone]
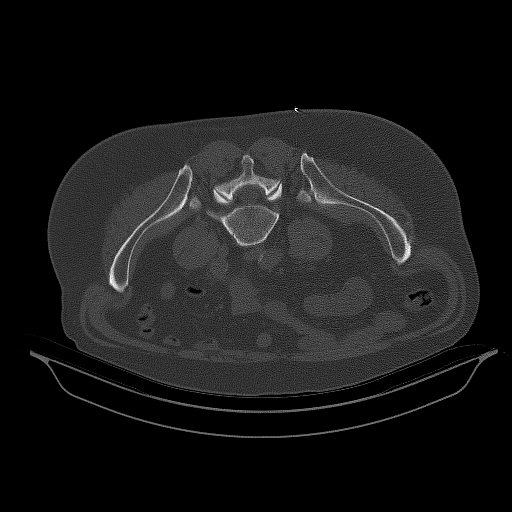

[9 of 32 positions shown; findings below may reference images not displayed]

EXAM:
CT GUIDED LEFT SI JOINT INJECTION

PROCEDURE:
After a thorough discussion of risks and benefits of the procedure,
including bleeding, infection, injury to nerves, blood vessels, and
adjacent structures, verbal and written consent was obtained. The
patient was placed prone on the CT table and localization was
performed over the sacrum. Target site marked using CT guidance. The
skin was prepped and draped in the usual sterile fashion using
Betadine soap.

After local anesthesia with 1% lidocaine without epinephrine and
subsequent deep anesthesia, a 3.5 inch 22 gauge spinal needle was
advanced into the left SI joint under intermittent CT guidance.

Injection of a small amount of Isovue-M 200 demonstrated
intra-articular contrast spread. Subsequently, 2 mL of 0.5%
bupivacaine were injected into the left SI joint. The needle was
removed and a sterile dressing was applied.

No complications were observed.
IMPRESSION: Successful CT-guided left SI joint injection.

## 2022-05-28 ENCOUNTER — Other Ambulatory Visit: Payer: Self-pay | Admitting: Cardiology

## 2022-05-29 ENCOUNTER — Other Ambulatory Visit: Payer: Self-pay | Admitting: Cardiology

## 2022-06-08 ENCOUNTER — Other Ambulatory Visit: Payer: Self-pay

## 2022-06-08 ENCOUNTER — Telehealth: Payer: Self-pay

## 2022-06-08 MED ORDER — ROSUVASTATIN CALCIUM 40 MG PO TABS: 20.0000 mg | ORAL_TABLET | Freq: Every day | ORAL | 3 refills | Status: DC

## 2022-06-08 MED ORDER — METOPROLOL SUCCINATE ER 25 MG PO TB24: 25.0000 mg | ORAL_TABLET | Freq: Every day | ORAL | 0 refills | Status: DC

## 2022-06-08 MED ORDER — LOSARTAN POTASSIUM 25 MG PO TABS: 12.5000 mg | ORAL_TABLET | Freq: Every day | ORAL | 0 refills | Status: DC

## 2022-06-08 NOTE — Telephone Encounter (Signed)
Patient is asking to have his medications refilled ASAP as he will be going out of town soon for Thanksgiving. He rescheduled his follow up w/Brittany (07/10/22), as he was seeing [place] prior.

## 2022-06-08 NOTE — Telephone Encounter (Signed)
Medication has been refilled.

## 2022-07-10 ENCOUNTER — Encounter: Payer: Self-pay | Admitting: Cardiology

## 2022-07-10 ENCOUNTER — Ambulatory Visit: Payer: Medicare Other | Admitting: Student

## 2022-07-10 ENCOUNTER — Ambulatory Visit: Payer: Medicare Other

## 2022-07-10 ENCOUNTER — Ambulatory Visit: Payer: Medicare Other | Admitting: Cardiology

## 2022-07-10 VITALS — BP 131/83 | HR 56 | Resp 16 | Ht 70.0 in | Wt 212.4 lb

## 2022-07-10 DIAGNOSIS — E7800 Pure hypercholesterolemia, unspecified: Secondary | ICD-10-CM

## 2022-07-10 DIAGNOSIS — I491 Atrial premature depolarization: Secondary | ICD-10-CM

## 2022-07-10 DIAGNOSIS — R0609 Other forms of dyspnea: Secondary | ICD-10-CM

## 2022-07-10 DIAGNOSIS — I10 Essential (primary) hypertension: Secondary | ICD-10-CM

## 2022-07-10 DIAGNOSIS — I25118 Atherosclerotic heart disease of native coronary artery with other forms of angina pectoris: Secondary | ICD-10-CM

## 2022-07-10 DIAGNOSIS — E1165 Type 2 diabetes mellitus with hyperglycemia: Secondary | ICD-10-CM

## 2022-07-10 MED ORDER — SEMAGLUTIDE (1 MG/DOSE) 4 MG/3ML ~~LOC~~ SOPN: 1.0000 mg | PEN_INJECTOR | SUBCUTANEOUS | 1 refills | Status: DC

## 2022-07-10 NOTE — Progress Notes (Unsigned)
Primary Physician:  Jani Gravel, MD   Patient ID: John Shaffer, male    DOB: April 19, 1946, 76 y.o.   MRN: [id number]  Subjective:   Chief Complaint  Patient presents with   Coronary Artery Disease   Hypertension   PAC (premature atrial contraction)   Follow-up    1 year    HPI: John Shaffer  is a 76 y.o.male  with CAD, hyperlipidemia, OSA on CPAP, DM without complications and NSTEMI 01/17/2016, SP stenting to right coronary artery with a bare-metal stent.  This is annual visit.  He has noticed worsening dyspnea on exertion.  Continues to have episodes of palpitations and is worried about atrial fibrillation.  Patient  states that over the past few months he has noticed worsening shortness of breath and easy fatigability.  He also continues to have occasional episodes of palpitation that is short lasting.  Admits to not using CPAP on a regular basis.  Past Medical History:  Diagnosis Date   AAA (abdominal aortic aneurysm) (HCC)    Atherosclerosis of native coronary artery of native heart with angina pectoris ([place]) 01/23/2019   Diabetes mellitus without complication (HCC)    H/O exercise stress test    chets pain, atypical   H/O non-ST elevation myocardial infarction (NSTEMI) 01/23/2019   Hip pain, left    HLD (hyperlipidemia)    HTN (hypertension) 01/23/2019   Hyperlipemia 01/23/2019   Social History   Tobacco Use   Smoking status: Former    Packs/day: 0.50    Years: 10.00    Total pack years: 5.00    Types: Cigarettes    Quit date: 1989    Years since quitting: 34.9   Smokeless tobacco: Never   Tobacco comments:    started smoking in 7th grade   Substance Use Topics   Alcohol use: Not Currently    Comment: rarely   Marital Status: Divorced  ROS   Review of Systems  Constitutional: Positive for malaise/fatigue.  Cardiovascular:  Positive for dyspnea on exertion and palpitations. Negative for chest pain and leg swelling.   Objective:  Blood pressure 131/83,  pulse (!) 56, resp. rate 16, height _0  (1.778 m), weight 212 lb 6.4 oz (96.3 kg), SpO2 95 %. Body mass index is 30.48 kg/m.      07/10/2022    1:03 PM 07/10/2021   10:43 AM 05/10/2021    3:49 PM  Vitals with BMI  Height _1  _2  5' 10.5"  Weight 212 lbs 6 oz 207 lbs 202 lbs 13 oz  BMI 30.48 27.7 41.28  Systolic 786 767 209  Diastolic 83 67 68  Pulse 56 61 64      Physical Exam Neck:     Vascular: No carotid bruit or JVD.  Cardiovascular:     Rate and Rhythm: Normal rate and regular rhythm.     Pulses: Intact distal pulses.     Heart sounds: Normal heart sounds. No murmur heard.    No gallop.  Pulmonary:     Effort: Pulmonary effort is normal.     Breath sounds: Normal breath sounds.  Abdominal:     General: Bowel sounds are normal.     Palpations: Abdomen is soft.  Musculoskeletal:     Right lower leg: No edema.     Left lower leg: No edema.   Physical exam unchanged compared to previous office visit.  Laboratory examination:  External labs:  Labs 12/01/2021:  Sodium 142, potassium 4.4, BUN 18,  creatinine 0.93, EGFR 81 mL, LFTs normal.  TSH mildly elevated at 5.60.  A1c 7.3%.  Hb 13.2/HCT 39.5, platelets 252, normal indicis.  Total cholesterol 129, triglycerides 95, HDL 35, LDL 75.  Non-HDL cholesterol 94.   Allergies   Allergies  Allergen Reactions   Bee Pollen Anaphylaxis    Other reaction(s): anaphylaxis   Bee Venom Anaphylaxis and Swelling    Wasp/ cause lips and feet to swell    Final Medications at End of Visit     Current Outpatient Medications:    alfuzosin (UROXATRAL) 10 MG 24 hr tablet, Take 10 mg by mouth daily., Disp: , Rfl:    aspirin EC 81 MG tablet, Take 81 mg by mouth daily., Disp: , Rfl:    losartan (COZAAR) 25 MG tablet, Take 0.5 tablets (12.5 mg total) by mouth daily., Disp: 45 tablet, Rfl: 0   metFORMIN (GLUCOPHAGE-XR) 750 MG 24 hr tablet, Take 750 mg by mouth daily., Disp: , Rfl:    metoprolol succinate (TOPROL XL) 25 MG 24  hr tablet, Take 1 tablet (25 mg total) by mouth daily., Disp: 90 tablet, Rfl: 0   Multiple Vitamin (MULTIVITAMIN WITH MINERALS) TABS, Take 1 tablet by mouth. occasionally, Disp: , Rfl:    rosuvastatin (CRESTOR) 40 MG tablet, Take 0.5 tablets (20 mg total) by mouth daily., Disp: 45 tablet, Rfl: 3   Semaglutide, 1 MG/DOSE, 4 MG/3ML SOPN, Inject 1 mg into the skin once a week., Disp: 9 mL, Rfl: 1  Radiology:   No results found.  Cardiac Studies:  Ambulatory cardiac telemetry 14 days 02/13/2021 - 02/27/2021: Predominant underlying rhythm was sinus.  Minimum heart rate 45 bpm, maximum heart rate 27 bpm, average heart rate 70 bpm.  2 episodes of nonsustained ventricular tachycardia each lasting 4 beats.  >1900 supraventricular tachycardia episodes, some of which were suggestive of atrial tachycardia.  Longest episode lasting 22.2 seconds with average rate of 114 bpm.  Idioventricular rhythm at a rate of 66 bpm was present.  Frequent PACs (11.8% burden isolated PACs) and rare PVCs.  Ventricular bigeminy and trigeminy present.  Patient's symptoms correlated with normal sinus rhythm as well as PACs and PVCs.  Ambulatory cardiac telemetry 12/13/2020 - 12/27/2020: Predominant underlying rhythm was sinus with minimum heart rate 50 bpm, maximum heart rate 20 bpm, average heart rate 73 bpm.  Patient triggered events correlated with sinus rhythm and PACs and PVCs. Patient 554 episodes of supraventricular tachycardia longest lasting 6 beats and fastest at a rate of 20 bpm.  Frequent PACs with 8.3% burden, rare PVCs.  Atrial couplets and triplets were rare.  Ventricular bigeminy present.  Lexiscan Tetrofosmin stress test 12/12/2020: Exercise nuclear stress test was performed using Bruce protocol. Patient reached 7 METS, and 84% of age predicted maximum heart rate. Exercise capacity was fair. No chest pain reported. Heart rate and hemodynamic response were normal. Stress EKG revealed no ischemic changes. Normal stress  myocardial perfusion. Decreased tracer uptake in inferior myocardium at rest likely due to gut attenuation.  Stress LVEF 75%. Low risk study.  Echocardiogram 12/13/2020:  Left ventricle cavity is normal in size and wall thickness. Normal global  wall motion. Normal LV systolic function with EF 67%. Normal diastolic  filling pattern.  Mild (Grade I) mitral regurgitation.  Mild tricuspid regurgitation.  Mild pulmonic regurgitation.  No evidence of pulmonary hypertension.  Previous study in 2019 noted PASP 35-40 mmHg.   Coronary angiogram 01/20/2016: Normal LVEF, 65%. Proximal RCA S/P 3.5 x 26 mm integrity bare-metal stent  implantation. Distal RCA 50% at bifurcation, PDA 80% (small) mid LAD 40% stenosis.  EKG:   EKG 07/10/2022 normal sinus rhythm at rate of 58 bpm, normal EKG.  No change compared to 09/23/2020.  Assessment:     ICD-10-CM   1. Coronary artery disease of native artery of native heart with stable angina pectoris ([place])  I25.118 PCV ECHOCARDIOGRAM COMPLETE    PCV MYOCARDIAL PERFUSION WO LEXISCAN    Semaglutide, 1 MG/DOSE, 4 MG/3ML SOPN    2. Dyspnea on exertion  R06.09 PCV ECHOCARDIOGRAM COMPLETE    PCV MYOCARDIAL PERFUSION WO LEXISCAN    3. PAC (premature atrial contraction)  I49.1 EKG 12-Lead    4. Essential hypertension  I10     5. Pure hypercholesterolemia  E78.00     6. Controlled type 2 diabetes mellitus with hyperglycemia, without long-term current use of insulin (HCC)  E11.65 Semaglutide, 1 MG/DOSE, 4 MG/3ML SOPN      Meds ordered this encounter  Medications   Semaglutide, 1 MG/DOSE, 4 MG/3ML SOPN    Sig: Inject 1 mg into the skin once a week.    Dispense:  9 mL    Refill:  1   There are no discontinued medications.  Recommendations:   GABOR LUSK  is a 76 y.o. male  with CAD, hyperlipidemia, OSA on CPAP, DM without complications and NSTEMI 01/17/2016, SP stenting to right coronary artery with a bare-metal stent.  This is annual visit.  He has  noticed worsening dyspnea on exertion.  Continues to have episodes of palpitations and is worried about atrial fibrillation.  1. Coronary artery disease of native artery of native heart with stable angina pectoris (HCC) Symptoms of dyspnea may be anginal equivalent however I suspect his weight gain, sleep apnea not adequately treated, is contributing to his symptoms.  No EKG abnormalities.  I would like to set him up for a exercise nuclear stress test and repeat an echocardiogram to exclude progression of coronary disease in view of diabetes mellitus and also evaluate for pulmonary hypertension.  2. Dyspnea on exertion As dictated above, blood pressure is well-controlled, dyspnea may be his anginal equivalent.  He is also gained some weight over the past 1 year.  3. PAC (premature atrial contraction) He has chronic PACs and he has had 2 event monitoring in the past with no documented atrial fibrillation.  However he is certainly at risk for developing atrial fibrillation in view of hypertension, coronary artery disease, age and also OSA not adequately treated as he does not use CPAP on a regular basis.  I encouraged him to go back on being on CPAP on a regular basis.  He may need to revisit his mask if he is not tolerating the present mask.  4. Essential hypertension Blood pressure is well-controlled.  Continue present medications, he is on ARB and also beta-blocker therapy.  5. Pure hypercholesterolemia Lipids are under good control, however his LDL has increased to >70, this is related to his weight gain as well.  I discussed this with the patient.  6. Controlled type 2 diabetes mellitus with hyperglycemia, without long-term current use of insulin (HCC) In view of obesity, uncontrolled diabetes mellitus, coronary artery disease, we discussed regarding Ozempic, will start him on 0.25 mg dose and uptitrated 2.5 mg as indicated, I will send 1 mg Rx to his mail order pharmacy.  I would like to see  him back in 4 to 6 weeks for follow-up.  I have extensively discussed with the  patient that he needs to avoid eating heavy meals and to cut his meals down by at least half to three fourths to avoid nausea and vomiting and increase his tolerance.  This is a 40 minutes of office visit and evaluation of his complex medical issues.    Adrian Prows, PA-C 07/10/2022, 1:49 PM Office: [phone]

## 2022-07-11 MED ORDER — SEMAGLUTIDE(0.25 OR 0.5MG/DOS) 2 MG/1.5ML ~~LOC~~ SOPN
0.2500 mg | PEN_INJECTOR | Freq: Once | SUBCUTANEOUS | Status: AC
  Administered 2022-07-11: 0.25 mg via SUBCUTANEOUS

## 2022-08-12 ENCOUNTER — Other Ambulatory Visit: Payer: Self-pay

## 2022-08-21 ENCOUNTER — Ambulatory Visit: Payer: Medicare Other | Admitting: Cardiology

## 2022-09-03 ENCOUNTER — Ambulatory Visit: Payer: Medicare Other

## 2022-09-03 DIAGNOSIS — I25118 Atherosclerotic heart disease of native coronary artery with other forms of angina pectoris: Secondary | ICD-10-CM

## 2022-09-03 DIAGNOSIS — R0609 Other forms of dyspnea: Secondary | ICD-10-CM

## 2022-09-08 LAB — PCV MYOCARDIAL PERFUSION WO LEXISCAN
Angina Index: 0
ST Depression (mm): 0 mm

## 2022-09-10 ENCOUNTER — Ambulatory Visit: Payer: Medicare Other | Admitting: Cardiology

## 2022-09-11 ENCOUNTER — Other Ambulatory Visit: Payer: Medicare Other

## 2022-09-13 ENCOUNTER — Ambulatory Visit: Payer: Medicare Other | Admitting: Cardiology

## 2022-09-19 ENCOUNTER — Ambulatory Visit: Payer: Medicare Other

## 2022-09-25 ENCOUNTER — Encounter: Payer: Self-pay | Admitting: Cardiology

## 2022-09-25 ENCOUNTER — Ambulatory Visit: Payer: Medicare Other | Admitting: Cardiology

## 2022-09-25 VITALS — BP 121/67 | HR 70 | Resp 16 | Ht 70.0 in | Wt 196.0 lb

## 2022-09-25 DIAGNOSIS — I25118 Atherosclerotic heart disease of native coronary artery with other forms of angina pectoris: Secondary | ICD-10-CM

## 2022-09-25 DIAGNOSIS — I10 Essential (primary) hypertension: Secondary | ICD-10-CM

## 2022-09-25 DIAGNOSIS — E7800 Pure hypercholesterolemia, unspecified: Secondary | ICD-10-CM

## 2022-09-25 DIAGNOSIS — E1165 Type 2 diabetes mellitus with hyperglycemia: Secondary | ICD-10-CM

## 2022-09-25 DIAGNOSIS — R0609 Other forms of dyspnea: Secondary | ICD-10-CM

## 2022-09-25 MED ORDER — OZEMPIC (2 MG/DOSE) 8 MG/3ML ~~LOC~~ SOPN: 2.0000 mg | PEN_INJECTOR | SUBCUTANEOUS | 3 refills | Status: DC

## 2022-09-25 NOTE — Progress Notes (Signed)
Primary Physician:  Deon Pilling, NP   Patient ID: John Shaffer, male    DOB: [birth year], 77 y.o.   MRN: [id number]  Subjective:   Chief Complaint  Patient presents with   Coronary Artery Disease   Shortness of Breath   Diabetes        Obesity   Follow-up    6 weeks    HPI: John Shaffer  is a 77 y.o.male  with CAD, hyperlipidemia, OSA on CPAP, DM without complications and NSTEMI 01/17/2016, SP stenting to right coronary artery with a bare-metal stent.  Patient was seen for worsening dyspnea and I had seen him about 6 weeks ago.  Patient was started on Ozempic, he has lost about 15 pounds in weight, he has noticed marked improvement in dyspnea.  He has not had any further palpitations or chest pain.  Feels well.  Past Medical History:  Diagnosis Date   AAA (abdominal aortic aneurysm) (HCC)    Atherosclerosis of native coronary artery of native heart with angina pectoris ([place]) 01/23/2019   Diabetes mellitus without complication (HCC)    H/O exercise stress test    chets pain, atypical   H/O non-ST elevation myocardial infarction (NSTEMI) 01/23/2019   Hip pain, left    HLD (hyperlipidemia)    HTN (hypertension) 01/23/2019   Hyperlipemia 01/23/2019   Social History   Tobacco Use   Smoking status: Former    Packs/day: 0.50    Years: 10.00    Total pack years: 5.00    Types: Cigarettes    Quit date: 1989    Years since quitting: 35.1   Smokeless tobacco: Never   Tobacco comments:    started smoking in 7th grade   Substance Use Topics   Alcohol use: Not Currently    Comment: rarely   Marital Status: Divorced  ROS   Review of Systems  Constitutional: Negative for malaise/fatigue.  Cardiovascular:  Positive for dyspnea on exertion (improved) and palpitations. Negative for chest pain and leg swelling.   Objective:  Blood pressure 121/67, pulse 70, resp. rate 16, height '5\' 10"'$  (1.778 m), weight 196 lb (88.9 kg), SpO2 94 %. Body mass index is 28.12 kg/m.       09/25/2022   10:09 AM 07/10/2022    1:03 PM 07/10/2021   10:43 AM  Vitals with BMI  Height '5\' 10"'$  '5\' 10"'$  '5\' 10"'$   Weight 196 lbs 212 lbs 6 oz 207 lbs  BMI 28.12 [id number]  Systolic [id number]  Diastolic 67 83 67  Pulse 70 56 61      Physical Exam Neck:     Vascular: No carotid bruit or JVD.  Cardiovascular:     Rate and Rhythm: Normal rate and regular rhythm.     Pulses: Intact distal pulses.     Heart sounds: Normal heart sounds. No murmur heard.    No gallop.  Pulmonary:     Effort: Pulmonary effort is normal.     Breath sounds: Normal breath sounds.  Abdominal:     General: Bowel sounds are normal.     Palpations: Abdomen is soft.  Musculoskeletal:     Right lower leg: No edema.     Left lower leg: No edema.     Laboratory examination:  External labs:  Labs 08/02/2022:  A1c 7.6%.  BUN 17, creatinine 1.02, potassium 4.2, EGFR 77 mL.  Hb 13.7/HCT 41.4, platelets 228.  Normal indicis.  Total cholesterol 125, triglycerides 114, HDL 35, LDL  68.  Labs 12/01/2021:  Sodium 142, potassium 4.4, BUN 18, creatinine 0.93, EGFR 81 mL, LFTs normal.  TSH mildly elevated at 5.60.  A1c 7.3%.  Hb 13.2/HCT 39.5, platelets 252, normal indicis.  Total cholesterol 129, triglycerides 95, HDL 35, LDL 75.  Non-HDL cholesterol 94.   Allergies   Allergies  Allergen Reactions   Bee Pollen Anaphylaxis    Other reaction(s): anaphylaxis   Bee Venom Anaphylaxis and Swelling    Wasp/ cause lips and feet to swell    Final Medications at End of Visit     Current Outpatient Medications:    alfuzosin (UROXATRAL) 10 MG 24 hr tablet, Take 10 mg by mouth daily., Disp: , Rfl:    aspirin EC 81 MG tablet, Take 81 mg by mouth daily., Disp: , Rfl:    levothyroxine (SYNTHROID) 25 MCG tablet, Take 25 mcg by mouth daily before breakfast., Disp: , Rfl:    losartan (COZAAR) 25 MG tablet, TAKE ONE-HALF TABLET BY MOUTH  DAILY, Disp: 45 tablet, Rfl: 3   metFORMIN (GLUCOPHAGE-XR) 750 MG 24 hr  tablet, Take 750 mg by mouth daily., Disp: , Rfl:    metoprolol succinate (TOPROL-XL) 25 MG 24 hr tablet, TAKE 1 TABLET BY MOUTH DAILY, Disp: 90 tablet, Rfl: 3   Multiple Vitamin (MULTIVITAMIN WITH MINERALS) TABS, Take 1 tablet by mouth. occasionally, Disp: , Rfl:    rosuvastatin (CRESTOR) 40 MG tablet, Take 0.5 tablets (20 mg total) by mouth daily., Disp: 45 tablet, Rfl: 3   Semaglutide, 2 MG/DOSE, (OZEMPIC, 2 MG/DOSE,) 8 MG/3ML SOPN, Inject 2 mg into the skin once a week., Disp: 9 mL, Rfl: 3  Radiology:   No results found.  Cardiac Studies:  Ambulatory cardiac telemetry 14 days 02/13/2021 - 02/27/2021: Predominant underlying rhythm was sinus.  Minimum heart rate 45 bpm, maximum heart rate 27 bpm, average heart rate 70 bpm.  2 episodes of nonsustained ventricular tachycardia each lasting 4 beats.  >1900 supraventricular tachycardia episodes, some of which were suggestive of atrial tachycardia.  Longest episode lasting 22.2 seconds with average rate of 114 bpm.  Idioventricular rhythm at a rate of 66 bpm was present.  Frequent PACs (11.8% burden isolated PACs) and rare PVCs.  Ventricular bigeminy and trigeminy present.  Patient's symptoms correlated with normal sinus rhythm as well as PACs and PVCs.  Ambulatory cardiac telemetry 12/13/2020 - 12/27/2020: Predominant underlying rhythm was sinus with minimum heart rate 50 bpm, maximum heart rate 20 bpm, average heart rate 73 bpm.  Patient triggered events correlated with sinus rhythm and PACs and PVCs. Patient 554 episodes of supraventricular tachycardia longest lasting 6 beats and fastest at a rate of 20 bpm.  Frequent PACs with 8.3% burden, rare PVCs.  Atrial couplets and triplets were rare.  Ventricular bigeminy present.  Coronary angiogram 01/20/2016: Normal LVEF, 65%. Proximal RCA S/P 3.5 x 26 mm integrity bare-metal stent implantation. Distal RCA 50% at bifurcation, PDA 80% (small) mid LAD 40% stenosis.  PCV MYOCARDIAL PERFUSION WO LEXISCAN  09/03/2022  Narrative Images from the original result were not included. Exercise nuclear stress test 09/03/2022: There is a fixed mild defect in the inferior region suggestive of diaphragmatic attenuation. NO ischemia. Overall LV systolic function is normal without regional wall motion abnormalities. Stress LV EF: 41%, visually appears normal. Normal ECG stress. The patient exercised for 6 minutes and 52 seconds of a Bruce protocol, achieving approximately 8.39 METs & 89% MPHR. The blood pressure response was normal. No significant change from 12/12/2020. Previously EF was  calculated at >55%. Low risk.   PCV ECHOCARDIOGRAM COMPLETE 09/19/2022  Narrative Echocardiogram 09/19/2022: Normal LV systolic function with visual EF 60-65%. Left ventricle cavity is normal in size. Normal left ventricular wall thickness. Normal global wall motion. Normal diastolic filling pattern. Calculated EF 70%. Left atrial cavity is slightly dilated. Structurally normal tricuspid valve with trace regurgitation. No evidence of pulmonary hypertension. No significant change compared to 11/2020.   EKG:   EKG 07/10/2022 normal sinus rhythm at rate of 58 bpm, normal EKG.  No change compared to 09/23/2020.  Assessment:     ICD-10-CM   1. Coronary artery disease of native artery of native heart with stable angina pectoris ([place])  I25.118 Semaglutide, 2 MG/DOSE, (OZEMPIC, 2 MG/DOSE,) 8 MG/3ML SOPN    2. Dyspnea on exertion  R06.09     3. Essential hypertension  I10     4. Pure hypercholesterolemia  E78.00     5. Controlled type 2 diabetes mellitus with hyperglycemia, without long-term current use of insulin (HCC)  E11.65 Semaglutide, 2 MG/DOSE, (OZEMPIC, 2 MG/DOSE,) 8 MG/3ML SOPN      Meds ordered this encounter  Medications   Semaglutide, 2 MG/DOSE, (OZEMPIC, 2 MG/DOSE,) 8 MG/3ML SOPN    Sig: Inject 2 mg into the skin once a week.    Dispense:  9 mL    Refill:  3   Medications Discontinued During This  Encounter  Medication Reason   Semaglutide, 1 MG/DOSE, 4 MG/3ML SOPN Dose change    Recommendations:   John Shaffer  is a 77 y.o. male  with CAD, hyperlipidemia, OSA on CPAP, DM without complications and NSTEMI 01/17/2016, SP stenting to right coronary artery with a bare-metal stent.  Patient was seen for worsening dyspnea and I had seen him about 6 weeks ago.  1. Coronary artery disease of native artery of native heart with stable angina pectoris ([place]) I with results of the nuclear stress test, normal stress test without ischemia, normal LVEF.  I reassured him.  His symptoms of dyspnea has improved with weight loss.  2. Dyspnea on exertion Patient dyspnea on exertion has improved with weight loss, he has had about 15 pounds in weight since starting Ozempic, since then he has noticed marked improvement in his dyspnea as well and in fact has been doing heavy labor activities at home without any significant discomfort.  3. Essential hypertension Blood pressure is under excellent control.  4. Pure hypercholesterolemia I reviewed his external labs from the [place], lipids have improved significantly as well.  Continue present dose of the statins.  5. Controlled type 2 diabetes mellitus with hyperglycemia, without long-term current use of insulin ([place]) His diabetes was previously uncontrolled, he is now on Ozempic, expect his diabetes to improve as well.  Will increase his dose from 1 mg q. weekly to 2 mg q. weekly.  I advised him to follow-up with his PCP, Deon Pilling, NP for further management of diabetes mellitus.  Otherwise stable from cardiac standpoint out like to see him back in 6 months for follow-up.     Adrian Prows, PA-C 09/25/2022, 10:53 AM Office: [phone]

## 2023-01-16 ENCOUNTER — Ambulatory Visit: Payer: Medicare Other | Admitting: Cardiology

## 2023-01-16 ENCOUNTER — Encounter: Payer: Self-pay | Admitting: Cardiology

## 2023-01-16 VITALS — BP 98/62 | HR 71 | Ht 70.0 in | Wt 192.0 lb

## 2023-01-16 DIAGNOSIS — E1165 Type 2 diabetes mellitus with hyperglycemia: Secondary | ICD-10-CM

## 2023-01-16 DIAGNOSIS — I25118 Atherosclerotic heart disease of native coronary artery with other forms of angina pectoris: Secondary | ICD-10-CM

## 2023-01-16 DIAGNOSIS — E7800 Pure hypercholesterolemia, unspecified: Secondary | ICD-10-CM

## 2023-01-16 NOTE — Progress Notes (Signed)
Primary Physician:  Linus Galas, NP   Patient ID: John Shaffer, male    DOB: 25-Oct-1945, 77 y.o.   MRN: [id number]  Subjective:   Chief Complaint  Patient presents with   Coronary artery disease of native artery of native heart wi   Follow-up    HPI: John Shaffer  is a 77 y.o.male  with CAD, hyperlipidemia, OSA on CPAP, DM without complications and NSTEMI 01/17/2016, SP stenting to right coronary artery with a bare-metal stent.  Patient was seen for worsening dyspnea and I had seen him about 6 weeks ago.  Patient was started on Ozempic, he has lost about 15 to 18 pounds in weight, he has noticed marked improvement in dyspnea.  He has noticed dizziness since being on Ozempic and also since weight loss otherwise feels well, remains without chest pain or palpitations.  Overall feels well.   Past Medical History:  Diagnosis Date   AAA (abdominal aortic aneurysm) (HCC)    Atherosclerosis of native coronary artery of native heart with angina pectoris (HCC) 01/23/2019   Diabetes mellitus without complication (HCC)    H/O exercise stress test    chets pain, atypical   H/O non-ST elevation myocardial infarction (NSTEMI) 01/23/2019   Hip pain, left    HLD (hyperlipidemia)    HTN (hypertension) 01/23/2019   Hyperlipemia 01/23/2019   Social History   Tobacco Use   Smoking status: Former    Packs/day: 0.50    Years: 10.00    Additional pack years: 0.00    Total pack years: 5.00    Types: Cigarettes    Quit date: 1989    Years since quitting: 35.4   Smokeless tobacco: Never   Tobacco comments:    started smoking in 7th grade   Substance Use Topics   Alcohol use: Not Currently    Comment: rarely   Marital Status: Divorced  ROS   Review of Systems  Cardiovascular:  Positive for dyspnea on exertion (much improved). Negative for chest pain, leg swelling and palpitations.   Objective:  Blood pressure 98/62, pulse 71, height 5\' 10"  (1.778 m), weight 192 lb (87.1 kg), SpO2 95  %. Body mass index is 27.55 kg/m.      01/16/2023   10:47 AM 09/25/2022   10:09 AM 07/10/2022    1:03 PM  Vitals with BMI  Height 5\' 10"  5\' 10"  5\' 10"   Weight 192 lbs 196 lbs 212 lbs 6 oz  BMI 27.55 28.12 30.48  Systolic 98 121 131  Diastolic 62 67 83  Pulse 71 70 56      Physical Exam Neck:     Vascular: No carotid bruit or JVD.  Cardiovascular:     Rate and Rhythm: Normal rate and regular rhythm.     Pulses: Intact distal pulses.     Heart sounds: Normal heart sounds. No murmur heard.    No gallop.  Pulmonary:     Effort: Pulmonary effort is normal.     Breath sounds: Normal breath sounds.  Abdominal:     General: Bowel sounds are normal.     Palpations: Abdomen is soft.  Musculoskeletal:     Right lower leg: No edema.     Left lower leg: No edema.     Laboratory examination:  External labs:  Cholesterol, total 114.000 m 01/02/2023 HDL 39.000 mg 01/02/2023 LDL 45.000 mg 01/02/2023 Triglycerides 185.000 m 01/02/2023  Labs 08/02/2022:  A1c 7.6%.  BUN 17, creatinine 1.02, potassium 4.2, EGFR 77  mL.  Hb 13.7/HCT 41.4, platelets 228.  Normal indicis.  Total cholesterol 125, triglycerides 114, HDL 35, LDL 68.  Labs 12/01/2021:  Sodium 142, potassium 4.4, BUN 18, creatinine 0.93, EGFR 81 mL, LFTs normal.  TSH mildly elevated at 5.60.  A1c 7.3%.  Hb 13.2/HCT 39.5, platelets 252, normal indicis.  Total cholesterol 129, triglycerides 95, HDL 35, LDL 75.  Non-HDL cholesterol 94.   Allergies   Allergies  Allergen Reactions   Bee Pollen Anaphylaxis    Other reaction(s): anaphylaxis   Bee Venom Anaphylaxis and Swelling    Wasp/ cause lips and feet to swell    Final Medications at End of Visit     Current Outpatient Medications:    alfuzosin (UROXATRAL) 10 MG 24 hr tablet, Take 10 mg by mouth daily., Disp: , Rfl:    aspirin EC 81 MG tablet, Take 81 mg by mouth daily., Disp: , Rfl:    levothyroxine (SYNTHROID) 25 MCG tablet, Take 25 mcg by mouth daily before  breakfast., Disp: , Rfl:    losartan (COZAAR) 25 MG tablet, TAKE ONE-HALF TABLET BY MOUTH  DAILY, Disp: 45 tablet, Rfl: 3   metFORMIN (GLUCOPHAGE-XR) 750 MG 24 hr tablet, Take 0.5 tablets by mouth daily., Disp: , Rfl:    Multiple Vitamin (MULTIVITAMIN WITH MINERALS) TABS, Take 1 tablet by mouth. occasionally, Disp: , Rfl:    rosuvastatin (CRESTOR) 40 MG tablet, Take 0.5 tablets (20 mg total) by mouth daily., Disp: 45 tablet, Rfl: 3   Semaglutide, 2 MG/DOSE, (OZEMPIC, 2 MG/DOSE,) 8 MG/3ML SOPN, Inject 2 mg into the skin once a week., Disp: 9 mL, Rfl: 3  Radiology:   No results found.  Cardiac Studies:  Ambulatory cardiac telemetry 14 days 02/13/2021 - 02/27/2021: Predominant underlying rhythm was sinus.  Minimum heart rate 45 bpm, maximum heart rate 27 bpm, average heart rate 70 bpm.  2 episodes of nonsustained ventricular tachycardia each lasting 4 beats.  >1900 supraventricular tachycardia episodes, some of which were suggestive of atrial tachycardia.  Longest episode lasting 22.2 seconds with average rate of 114 bpm.  Idioventricular rhythm at a rate of 66 bpm was present.  Frequent PACs (11.8% burden isolated PACs) and rare PVCs.  Ventricular bigeminy and trigeminy present.  Patient's symptoms correlated with normal sinus rhythm as well as PACs and PVCs.  Ambulatory cardiac telemetry 12/13/2020 - 12/27/2020: Predominant underlying rhythm was sinus with minimum heart rate 50 bpm, maximum heart rate 20 bpm, average heart rate 73 bpm.  Patient triggered events correlated with sinus rhythm and PACs and PVCs. Patient 554 episodes of supraventricular tachycardia longest lasting 6 beats and fastest at a rate of 20 bpm.  Frequent PACs with 8.3% burden, rare PVCs.  Atrial couplets and triplets were rare.  Ventricular bigeminy present.  Coronary angiogram 01/20/2016: Normal LVEF, 65%. Proximal RCA S/P 3.5 x 26 mm integrity bare-metal stent implantation. Distal RCA 50% at bifurcation, PDA 80% (small) mid LAD 40%  stenosis.  PCV MYOCARDIAL PERFUSION WO LEXISCAN 09/03/2022  Narrative Images from the original result were not included. Exercise nuclear stress test 09/03/2022: There is a fixed mild defect in the inferior region suggestive of diaphragmatic attenuation. NO ischemia. Overall LV systolic function is normal without regional wall motion abnormalities. Stress LV EF: 41%, visually appears normal. Normal ECG stress. The patient exercised for 6 minutes and 52 seconds of a Bruce protocol, achieving approximately 8.39 METs & 89% MPHR. The blood pressure response was normal. No significant change from 12/12/2020. Previously EF was calculated at >  55%. Low risk.   PCV ECHOCARDIOGRAM COMPLETE 09/19/2022  Narrative Echocardiogram 09/19/2022: Normal LV systolic function with visual EF 60-65%. Left ventricle cavity is normal in size. Normal left ventricular wall thickness. Normal global wall motion. Normal diastolic filling pattern. Calculated EF 70%. Left atrial cavity is slightly dilated. Structurally normal tricuspid valve with trace regurgitation. No evidence of pulmonary hypertension. No significant change compared to 11/2020.   EKG:   EKG 01/17/2023: Normal sinus rhythm at rate of 64 bpm, normal EKG.  No change from 07/10/2022.  Assessment:     ICD-10-CM   1. Coronary artery disease of native artery of native heart with stable angina pectoris (HCC)  I25.118 EKG 12-Lead    2. Controlled type 2 diabetes mellitus with hyperglycemia, without long-term current use of insulin (HCC)  E11.65     3. Pure hypercholesterolemia  E78.00       No orders of the defined types were placed in this encounter.  Medications Discontinued During This Encounter  Medication Reason   metoprolol succinate (TOPROL-XL) 25 MG 24 hr tablet Completed Course     Recommendations:   John Shaffer  is a 77 y.o. male  with CAD, hyperlipidemia, OSA on CPAP, DM without complications and NSTEMI 01/17/2016, SP stenting  to right coronary artery with a bare-metal stent.  Patient was seen for worsening dyspnea and I had seen him about 6 weeks ago.  1. Coronary artery disease of native artery of native heart with stable angina pectoris (HCC) EKG normal.  He remains angina free.  He is starting Ozempic. - EKG 12-Lead  2. Controlled type 2 diabetes mellitus with hyperglycemia, without long-term current use of insulin (HCC) Blood sugar has now improved since being on Ozempic, proximately about 16 to 18 pounds in weight over the past 3 months 3 to 4 months.  As blood sugars have improved, although I do not have his latest A1c, patient states that it is <7 hence advised him to reduce the dose of metformin to 1/2 tablet daily 750 mg.  This is to improve his bowel movements and frequency of stool.  With weight loss, his blood pressure has been very soft and he occasionally has had dizziness.  I will go ahead and discontinue metoprolol as he does not have any angina and has remained stable.  Will continue with losartan at 25 mg 1/2 tablet in the evening for cardiovascular protection as he is diabetic as well.  3. Pure hypercholesterolemia Reviewed his external labs, lipids under excellent control.  Overall he is very stable from cardiovascular standpoint, I am very pleased with his weight loss, advised him to continue to try to get some weight off at least another 10 pounds and I will see him back on annual basis.     Yates Decamp, PA-C 01/16/2023, 11:33 AM Office: [phone]

## 2023-03-26 ENCOUNTER — Ambulatory Visit: Payer: Medicare Other | Admitting: Cardiology

## 2023-03-27 ENCOUNTER — Ambulatory Visit: Payer: Medicare Other | Admitting: Podiatry

## 2023-03-27 DIAGNOSIS — M216X2 Other acquired deformities of left foot: Secondary | ICD-10-CM | POA: Diagnosis not present

## 2023-03-27 NOTE — Progress Notes (Signed)
Subjective:  Patient ID: John Shaffer, male    DOB: September 07, 1945,  MRN: [id number]  Chief Complaint  Patient presents with   Callouses    77 y.o. male presents with the above complaint.  Patient presents with left submetatarsal 5 porokeratotic lesion painful to touch is progressive gotten worse worse with ambulation is with pressure he would like to discuss treatment options he has not seen MRIs prior to seeing me.  Denies any other acute complaints.   Review of Systems: Negative except as noted in the HPI. Denies N/V/F/Ch.  Past Medical History:  Diagnosis Date   AAA (abdominal aortic aneurysm) (HCC)    Atherosclerosis of native coronary artery of native heart with angina pectoris (HCC) 01/23/2019   Diabetes mellitus without complication (HCC)    H/O exercise stress test    chets pain, atypical   H/O non-ST elevation myocardial infarction (NSTEMI) 01/23/2019   Hip pain, left    HLD (hyperlipidemia)    HTN (hypertension) 01/23/2019   Hyperlipemia 01/23/2019    Current Outpatient Medications:    alfuzosin (UROXATRAL) 10 MG 24 hr tablet, Take 10 mg by mouth daily., Disp: , Rfl:    aspirin EC 81 MG tablet, Take 81 mg by mouth daily., Disp: , Rfl:    levothyroxine (SYNTHROID) 25 MCG tablet, Take 25 mcg by mouth daily before breakfast., Disp: , Rfl:    losartan (COZAAR) 25 MG tablet, TAKE ONE-HALF TABLET BY MOUTH  DAILY, Disp: 45 tablet, Rfl: 3   metFORMIN (GLUCOPHAGE-XR) 750 MG 24 hr tablet, Take 0.5 tablets by mouth daily., Disp: , Rfl:    Multiple Vitamin (MULTIVITAMIN WITH MINERALS) TABS, Take 1 tablet by mouth. occasionally, Disp: , Rfl:    rosuvastatin (CRESTOR) 40 MG tablet, Take 0.5 tablets (20 mg total) by mouth daily., Disp: 45 tablet, Rfl: 3   Semaglutide, 2 MG/DOSE, (OZEMPIC, 2 MG/DOSE,) 8 MG/3ML SOPN, Inject 2 mg into the skin once a week., Disp: 9 mL, Rfl: 3  Social History   Tobacco Use  Smoking Status Former   Current packs/day: 0.00   Average packs/day: 0.5  packs/day for 10.0 years (5.0 ttl pk-yrs)   Types: Cigarettes   Start date: 64   Quit date: 1989   Years since quitting: 35.6  Smokeless Tobacco Never  Tobacco Comments   started smoking in 7th grade     Allergies  Allergen Reactions   Bee Pollen Anaphylaxis    Other reaction(s): anaphylaxis   Bee Venom Anaphylaxis and Swelling    Wasp/ cause lips and feet to swell   Objective:  There were no vitals filed for this visit. There is no height or weight on file to calculate BMI. Constitutional Well developed. Well nourished.  Vascular Dorsalis pedis pulses palpable bilaterally. Posterior tibial pulses palpable bilaterally. Capillary refill normal to all digits.  No cyanosis or clubbing noted. Pedal hair growth normal.  Neurologic Normal speech. Oriented to person, place, and time. Epicritic sensation to light touch grossly present bilaterally.  Dermatologic Nails well groomed and normal in appearance. No open wounds. No skin lesions.  Orthopedic: Pain on palpation left submetatarsal 5.  Plantarflexed fifth metatarsal noted.   Radiographs: None Assessment:   1. Plantar flexed metatarsal bone of left foot    Plan:  Patient was evaluated and treated and all questions answered.  Left plantarflexed fifth metatarsal -He is having all questions and concerns were discussed with the patient extensive detail.  Given the amount of pain that he is having he will benefit  from debridement of the lesion using chisel blade handle the lesion was debrided down to healthy dry tissue.  Complication noted no pinpoint bleeding noted -Shoe gear modification discussed.  No follow-ups on file.

## 2023-05-08 ENCOUNTER — Ambulatory Visit: Payer: Medicare Other | Admitting: Podiatry

## 2023-05-08 ENCOUNTER — Encounter: Payer: Self-pay | Admitting: Podiatry

## 2023-05-08 VITALS — BP 100/57 | HR 70

## 2023-05-08 DIAGNOSIS — M216X2 Other acquired deformities of left foot: Secondary | ICD-10-CM | POA: Diagnosis not present

## 2023-05-08 NOTE — Progress Notes (Signed)
Subjective:  Patient ID: John Shaffer, male    DOB: August 23, 1945,  MRN: [id number]  Chief Complaint  Patient presents with   Callouses    "It still hurts."    77 y.o. male presents with the above complaint.  Patient presents with left submetatarsal 5 porokeratotic lesion painful to touch is progressive gotten worse worse with ambulation is with pressure he would like to discuss treatment options he has not seen MRIs prior to seeing me.  Denies any other acute complaints.   Review of Systems: Negative except as noted in the HPI. Denies N/V/F/Ch.  Past Medical History:  Diagnosis Date   AAA (abdominal aortic aneurysm) (HCC)    Atherosclerosis of native coronary artery of native heart with angina pectoris (HCC) 01/23/2019   Diabetes mellitus without complication (HCC)    H/O exercise stress test    chets pain, atypical   H/O non-ST elevation myocardial infarction (NSTEMI) 01/23/2019   Hip pain, left    HLD (hyperlipidemia)    HTN (hypertension) 01/23/2019   Hyperlipemia 01/23/2019    Current Outpatient Medications:    alfuzosin (UROXATRAL) 10 MG 24 hr tablet, Take 10 mg by mouth daily., Disp: , Rfl:    aspirin EC 81 MG tablet, Take 81 mg by mouth daily., Disp: , Rfl:    levothyroxine (SYNTHROID) 25 MCG tablet, Take 25 mcg by mouth daily before breakfast., Disp: , Rfl:    losartan (COZAAR) 25 MG tablet, TAKE ONE-HALF TABLET BY MOUTH  DAILY, Disp: 45 tablet, Rfl: 3   metFORMIN (GLUCOPHAGE-XR) 750 MG 24 hr tablet, Take 0.5 tablets by mouth daily., Disp: , Rfl:    Multiple Vitamin (MULTIVITAMIN WITH MINERALS) TABS, Take 1 tablet by mouth. occasionally, Disp: , Rfl:    rosuvastatin (CRESTOR) 40 MG tablet, Take 0.5 tablets (20 mg total) by mouth daily., Disp: 45 tablet, Rfl: 3   Semaglutide, 2 MG/DOSE, (OZEMPIC, 2 MG/DOSE,) 8 MG/3ML SOPN, Inject 2 mg into the skin once a week., Disp: 9 mL, Rfl: 3  Social History   Tobacco Use  Smoking Status Former   Current packs/day: 0.00    Average packs/day: 0.5 packs/day for 10.0 years (5.0 ttl pk-yrs)   Types: Cigarettes   Start date: 77   Quit date: 1989   Years since quitting: 35.7  Smokeless Tobacco Never  Tobacco Comments   started smoking in 7th grade     Allergies  Allergen Reactions   Bee Pollen Anaphylaxis    Other reaction(s): anaphylaxis   Bee Venom Anaphylaxis and Swelling    Wasp/ cause lips and feet to swell   Objective:   Vitals:   05/08/23 1018  BP: (!) 100/57  Pulse: 70   There is no height or weight on file to calculate BMI. Constitutional Well developed. Well nourished.  Vascular Dorsalis pedis pulses palpable bilaterally. Posterior tibial pulses palpable bilaterally. Capillary refill normal to all digits.  No cyanosis or clubbing noted. Pedal hair growth normal.  Neurologic Normal speech. Oriented to person, place, and time. Epicritic sensation to light touch grossly present bilaterally.  Dermatologic Nails well groomed and normal in appearance. No open wounds. No skin lesions.  Orthopedic: Pain on palpation left submetatarsal 5.  Plantarflexed fifth metatarsal noted.   Radiographs: None Assessment:   No diagnosis found.  Plan:  Patient was evaluated and treated and all questions answered.  Left plantarflexed fifth metatarsal -He is having all questions and concerns were discussed with the patient extensive detail.  Given the amount of pain that  he is having he will benefit from debridement of the lesion using chisel blade handle the lesion was debrided down to healthy dry tissue.  Complication noted no pinpoint bleeding noted -Shoe gear modification discussed.  No follow-ups on file.

## 2023-05-15 ENCOUNTER — Ambulatory Visit
Admission: EM | Admit: 2023-05-15 | Discharge: 2023-05-15 | Disposition: A | Discharge status: Home / Self Care | Payer: Medicare Other | Attending: Internal Medicine | Admitting: Internal Medicine

## 2023-05-15 ENCOUNTER — Ambulatory Visit: Payer: Medicare Other

## 2023-05-15 DIAGNOSIS — J40 Bronchitis, not specified as acute or chronic: Secondary | ICD-10-CM

## 2023-05-15 DIAGNOSIS — J329 Chronic sinusitis, unspecified: Secondary | ICD-10-CM

## 2023-05-15 MED ORDER — PROMETHAZINE-DM 6.25-15 MG/5ML PO SYRP: 2.5000 mL | ORAL_SOLUTION | Freq: Three times a day (TID) | ORAL | 0 refills | Status: DC | PRN

## 2023-05-15 MED ORDER — AMOXICILLIN-POT CLAVULANATE 875-125 MG PO TABS: 1.0000 | ORAL_TABLET | Freq: Two times a day (BID) | ORAL | 0 refills | Status: DC

## 2023-05-15 MED ORDER — PREDNISONE 10 MG PO TABS: 30.0000 mg | ORAL_TABLET | Freq: Every day | ORAL | 0 refills | Status: DC

## 2023-05-15 NOTE — ED Provider Notes (Signed)
[facility]  Note:  This document was prepared using Conservation officer, historic buildings and may include unintentional dictation errors.  MRN: [id number] DOB: 1945-10-13  Subjective:   CHANEY SY is a 77 y.o. male presenting for 2-week history of persistent throat pain, runny and stuffy nose, productive cough.  Has also had intermittent fevers.  The coughing has elicited chest pain, shortness of breath especially with coughing fits.  Patient is a former smoker.  Has a history of heart disease but no recent heart surgeries.  No current facility-administered medications for this encounter.  Current Outpatient Medications:    alfuzosin (UROXATRAL) 10 MG 24 hr tablet, Take 10 mg by mouth daily., Disp: , Rfl:    aspirin EC 81 MG tablet, Take 81 mg by mouth daily., Disp: , Rfl:    levothyroxine (SYNTHROID) 25 MCG tablet, Take 25 mcg by mouth daily before breakfast., Disp: , Rfl:    losartan (COZAAR) 25 MG tablet, TAKE ONE-HALF TABLET BY MOUTH  DAILY, Disp: 45 tablet, Rfl: 3   metFORMIN (GLUCOPHAGE-XR) 750 MG 24 hr tablet, Take 0.5 tablets by mouth daily., Disp: , Rfl:    Multiple Vitamin (MULTIVITAMIN WITH MINERALS) TABS, Take 1 tablet by mouth. occasionally, Disp: , Rfl:    rosuvastatin (CRESTOR) 40 MG tablet, Take 0.5 tablets (20 mg total) by mouth daily., Disp: 45 tablet, Rfl: 3   Semaglutide, 2 MG/DOSE, (OZEMPIC, 2 MG/DOSE,) 8 MG/3ML SOPN, Inject 2 mg into the skin once a week., Disp: 9 mL, Rfl: 3   Allergies  Allergen Reactions   Bee Pollen Anaphylaxis    Other reaction(s): anaphylaxis   Bee Venom Anaphylaxis and Swelling    Wasp/ cause lips and feet to swell    Past Medical History:  Diagnosis Date   AAA (abdominal aortic aneurysm) (HCC)    Atherosclerosis of native coronary artery of native heart with angina pectoris (HCC) 01/23/2019   Diabetes mellitus without complication (HCC)    H/O exercise stress test    chets pain, atypical   H/O non-ST  elevation myocardial infarction (NSTEMI) 01/23/2019   Hip pain, left    HLD (hyperlipidemia)    HTN (hypertension) 01/23/2019   Hyperlipemia 01/23/2019     Past Surgical History:  Procedure Laterality Date   SHOULDER SURGERY      Family History  Problem Relation Age of Onset   Lung disease Mother    Heart failure Father    Cancer Maternal Grandmother    Lung disease Maternal Grandfather    Cancer Paternal Grandfather     Social History   Tobacco Use   Smoking status: Former    Current packs/day: 0.00    Average packs/day: 0.5 packs/day for 10.0 years (5.0 ttl pk-yrs)    Types: Cigarettes    Start date: 78    Quit date: 1989    Years since quitting: 35.8   Smokeless tobacco: Never   Tobacco comments:    started smoking in 7th grade   Vaping Use   Vaping status: Never Used  Substance Use Topics   Alcohol use: Yes    Comment: rarely    Drug use: No    ROS   Objective:   Vitals: BP 119/66 (BP Location: Right Arm)   Pulse 78   Temp 97.9 F (36.6 C) (Oral)   Resp 18   SpO2 94%   Physical Exam Constitutional:      General: He is not in acute distress.    Appearance: Normal  appearance. He is well-developed and normal weight. He is not ill-appearing, toxic-appearing or diaphoretic.  HENT:     Head: Normocephalic and atraumatic.     Right Ear: Tympanic membrane, ear canal and external ear normal. No drainage, swelling or tenderness. No middle ear effusion. There is no impacted cerumen. Tympanic membrane is not erythematous or bulging.     Left Ear: Tympanic membrane, ear canal and external ear normal. No drainage, swelling or tenderness.  No middle ear effusion. There is no impacted cerumen. Tympanic membrane is not erythematous or bulging.     Nose: Congestion present. No rhinorrhea.     Mouth/Throat:     Mouth: Mucous membranes are moist.     Pharynx: No oropharyngeal exudate or posterior oropharyngeal erythema.  Eyes:     General: No scleral icterus.        Right eye: No discharge.        Left eye: No discharge.     Extraocular Movements: Extraocular movements intact.     Conjunctiva/sclera: Conjunctivae normal.  Cardiovascular:     Rate and Rhythm: Normal rate and regular rhythm.     Heart sounds: Normal heart sounds. No murmur heard.    No friction rub. No gallop.  Pulmonary:     Effort: Pulmonary effort is normal. No respiratory distress.     Breath sounds: No stridor. Rhonchi present. No wheezing or rales.  Musculoskeletal:     Cervical back: Normal range of motion and neck supple. No rigidity. No muscular tenderness.  Neurological:     General: No focal deficit present.     Mental Status: He is alert and oriented to person, place, and time.  Psychiatric:        Mood and Affect: Mood normal.        Behavior: Behavior normal.        Thought Content: Thought content normal.     Assessment and Plan :   PDMP not reviewed this encounter.  1. Sinobronchitis    No history of CKD per patient.  Working diagnosis is sinobronchitis.  Recommended managing with Augmentin, prednisone.  Use supportive care otherwise.  X-ray over-read was pending at time of discharge, recommended follow up with only abnormal results. Otherwise will not call for negative over-read. Patient was in agreement.  Counseled patient on potential for adverse effects with medications prescribed/recommended today, ER and return-to-clinic precautions discussed, patient verbalized understanding.    Wallis Bamberg, PA-C 05/15/23 1323

## 2023-05-15 NOTE — ED Triage Notes (Signed)
Pt c/o prod cough, sore throat, runny nose x 2 weeks-intermittent fevers-NAD-steady gait

## 2023-05-15 NOTE — Discharge Instructions (Addendum)
I am managing you for an infection called sinobronchitis. This means you need an oral antibiotic, amoxicillin-clavulanate, and a steroid, prednisone. I will call with your chest x-ray results and make updates as needed.

## 2023-06-11 ENCOUNTER — Other Ambulatory Visit: Payer: Self-pay

## 2023-06-11 MED ORDER — ROSUVASTATIN CALCIUM 40 MG PO TABS: 20.0000 mg | ORAL_TABLET | Freq: Every day | ORAL | 3 refills | Status: DC

## 2023-08-30 DIAGNOSIS — R3915 Urgency of urination: Secondary | ICD-10-CM | POA: Diagnosis not present

## 2023-09-10 DIAGNOSIS — E1165 Type 2 diabetes mellitus with hyperglycemia: Secondary | ICD-10-CM | POA: Diagnosis not present

## 2023-09-16 DIAGNOSIS — E039 Hypothyroidism, unspecified: Secondary | ICD-10-CM | POA: Diagnosis not present

## 2023-09-16 DIAGNOSIS — I10 Essential (primary) hypertension: Secondary | ICD-10-CM | POA: Diagnosis not present

## 2023-09-16 DIAGNOSIS — E1165 Type 2 diabetes mellitus with hyperglycemia: Secondary | ICD-10-CM | POA: Diagnosis not present

## 2023-09-16 DIAGNOSIS — I2510 Atherosclerotic heart disease of native coronary artery without angina pectoris: Secondary | ICD-10-CM | POA: Diagnosis not present

## 2023-10-28 DIAGNOSIS — R310 Gross hematuria: Secondary | ICD-10-CM | POA: Diagnosis not present

## 2023-10-28 DIAGNOSIS — R8271 Bacteriuria: Secondary | ICD-10-CM | POA: Diagnosis not present

## 2023-10-28 DIAGNOSIS — R3915 Urgency of urination: Secondary | ICD-10-CM | POA: Diagnosis not present

## 2023-11-20 DIAGNOSIS — R310 Gross hematuria: Secondary | ICD-10-CM | POA: Diagnosis not present

## 2023-11-20 DIAGNOSIS — K4090 Unilateral inguinal hernia, without obstruction or gangrene, not specified as recurrent: Secondary | ICD-10-CM | POA: Diagnosis not present

## 2023-11-20 DIAGNOSIS — R3915 Urgency of urination: Secondary | ICD-10-CM | POA: Diagnosis not present

## 2023-11-20 DIAGNOSIS — K429 Umbilical hernia without obstruction or gangrene: Secondary | ICD-10-CM | POA: Diagnosis not present

## 2024-01-06 DIAGNOSIS — I10 Essential (primary) hypertension: Secondary | ICD-10-CM | POA: Diagnosis not present

## 2024-01-06 DIAGNOSIS — Z0000 Encounter for general adult medical examination without abnormal findings: Secondary | ICD-10-CM | POA: Diagnosis not present

## 2024-01-06 DIAGNOSIS — E789 Disorder of lipoprotein metabolism, unspecified: Secondary | ICD-10-CM | POA: Diagnosis not present

## 2024-01-06 DIAGNOSIS — E1165 Type 2 diabetes mellitus with hyperglycemia: Secondary | ICD-10-CM | POA: Diagnosis not present

## 2024-01-06 DIAGNOSIS — E039 Hypothyroidism, unspecified: Secondary | ICD-10-CM | POA: Diagnosis not present

## 2024-01-13 ENCOUNTER — Ambulatory Visit: Payer: Medicare Other | Admitting: Cardiology

## 2024-01-13 DIAGNOSIS — E1165 Type 2 diabetes mellitus with hyperglycemia: Secondary | ICD-10-CM | POA: Diagnosis not present

## 2024-01-13 DIAGNOSIS — I2510 Atherosclerotic heart disease of native coronary artery without angina pectoris: Secondary | ICD-10-CM | POA: Diagnosis not present

## 2024-01-13 DIAGNOSIS — J4540 Moderate persistent asthma, uncomplicated: Secondary | ICD-10-CM | POA: Diagnosis not present

## 2024-01-13 DIAGNOSIS — I10 Essential (primary) hypertension: Secondary | ICD-10-CM | POA: Diagnosis not present

## 2024-01-13 DIAGNOSIS — Z0000 Encounter for general adult medical examination without abnormal findings: Secondary | ICD-10-CM | POA: Diagnosis not present

## 2024-01-13 DIAGNOSIS — I714 Abdominal aortic aneurysm, without rupture, unspecified: Secondary | ICD-10-CM | POA: Diagnosis not present

## 2024-01-13 DIAGNOSIS — E039 Hypothyroidism, unspecified: Secondary | ICD-10-CM | POA: Diagnosis not present

## 2024-01-13 DIAGNOSIS — I880 Nonspecific mesenteric lymphadenitis: Secondary | ICD-10-CM | POA: Diagnosis not present

## 2024-01-13 DIAGNOSIS — E789 Disorder of lipoprotein metabolism, unspecified: Secondary | ICD-10-CM | POA: Diagnosis not present

## 2024-01-15 ENCOUNTER — Encounter (HOSPITAL_BASED_OUTPATIENT_CLINIC_OR_DEPARTMENT_OTHER): Payer: Self-pay

## 2024-01-17 ENCOUNTER — Encounter: Payer: Self-pay | Admitting: Cardiology

## 2024-01-17 ENCOUNTER — Ambulatory Visit: Attending: Cardiology | Admitting: Cardiology

## 2024-01-17 ENCOUNTER — Other Ambulatory Visit (HOSPITAL_COMMUNITY): Payer: Self-pay

## 2024-01-17 VITALS — BP 121/72 | HR 67 | Resp 16 | Ht 70.0 in | Wt 199.6 lb

## 2024-01-17 DIAGNOSIS — I491 Atrial premature depolarization: Secondary | ICD-10-CM | POA: Diagnosis not present

## 2024-01-17 DIAGNOSIS — I25118 Atherosclerotic heart disease of native coronary artery with other forms of angina pectoris: Secondary | ICD-10-CM | POA: Diagnosis not present

## 2024-01-17 DIAGNOSIS — I10 Essential (primary) hypertension: Secondary | ICD-10-CM

## 2024-01-17 DIAGNOSIS — E7800 Pure hypercholesterolemia, unspecified: Secondary | ICD-10-CM

## 2024-01-17 DIAGNOSIS — R0609 Other forms of dyspnea: Secondary | ICD-10-CM | POA: Diagnosis not present

## 2024-01-17 MED ORDER — NITROGLYCERIN 0.4 MG SL SUBL
0.4000 mg | SUBLINGUAL_TABLET | SUBLINGUAL | 1 refills | Status: AC | PRN
  Filled 2024-01-17: qty 25, 8d supply, fill #0

## 2024-01-17 NOTE — Patient Instructions (Signed)
 Medication Instructions:  Your physician recommends that you continue on your current medications as directed. Please refer to the Current Medication list given to you today.  *If you need a refill on your cardiac medications before your next appointment, please call your pharmacy*  Lab Work: none If you have labs (blood work) drawn today and your tests are completely normal, you will receive your results only by: MyChart Message (if you have MyChart) OR A paper copy in the mail If you have any lab test that is abnormal or we need to change your treatment, we will call you to review the results.  Testing/Procedures: none  Follow-Up: At [facility], you and your health needs are our priority.  As part of our continuing mission to provide you with exceptional heart care, our providers are all part of one team.  This team includes your primary Cardiologist (physician) and Advanced Practice Providers or APPs (Physician Assistants and Nurse Practitioners) who all work together to provide you with the care you need, when you need it.  Your next appointment:   12 month(s)  Provider:   Knox Perl, MD    We recommend signing up for the patient portal called "MyChart".  Sign up information is provided on this After Visit Summary.  MyChart is used to connect with patients for Virtual Visits (Telemedicine).  Patients are able to view lab/test results, encounter notes, upcoming appointments, etc.  Non-urgent messages can be sent to your provider as well.   To learn more about what you can do with MyChart, go to [email or web address].   Other Instructions

## 2024-01-17 NOTE — Progress Notes (Signed)
 Cardiology Office Note:  .   Date:  01/18/2024  ID:  John Shaffer, DOB [birth year], MRN [id number] PCP: Corlis Pagan, NP  [facility] Providers Cardiologist:  Gordy Bergamo, MD Electrophysiologist:  OLE ONEIDA HOLTS, MD   History of Present Illness: .   John Shaffer is a 78 y.o. with CAD, hyperlipidemia, OSA on CPAP, DM without complications and NSTEMI 01/17/2016, SP stenting to right coronary artery with a bare-metal stent.  He has had a exercise low risk nuclear stress test on 09/03/2022 with no ischemia, diaphragmatic attenuation with EF 41% but visually appeared normal, echocardiogram on 09/19/2022 revealing normal LVEF at 60 to 65%.  This is an annual visit.  Except for chronic dyspnea he remains asymptomatic and continues to exercise on a regular basis with occasional episodes of angina with extremes of exertion not requiring NTG.  Discussed the use of AI scribe software for clinical note transcription with the patient, who gave verbal consent to proceed.  History of Present Illness John Shaffer is a 78 year old male with coronary artery disease who presents with shortness of breath and exertional chest pain.  He experiences shortness of breath and exertional chest pain during activities like pushing a lawnmower, but not during routine activities or while walking. The chest pain is discomfort that occurs only with significant exertion. He has not used nitroglycerin  recently, although he has an old prescription.  He has coronary artery disease with a stent placed in 2017. A stress test and echocardiogram in February 2024 showed no significant issues. He is on rosuvastatin  40 mg, taking half a tablet daily, and losartan  12.5 mg daily.  His diabetes management includes a recent switch to Ozempic , currently on a 1 mg dose, with improved blood sugar levels. [facility] currently around 3 cm. He has a history of extra heartbeats since 2021,  but is not on medication for this.  Labs   External Labs:  [facility] labs 01/02/2023:  Total cholesterol 114, triglycerides 87, HDL 39, LDL 45.  Labs 01/06/2024:  A1c 6.5%.  TSH mildly elevated at 5.960.  Serum creatinine 1.020, creatinine clearance 63.59, EGFR 77 mL.  CBC Normal  ROS  Review of Systems  Cardiovascular:  Positive for chest pain (stable) and dyspnea on exertion. Negative for leg swelling.    Physical Exam:   VS:  BP 121/72 (BP Location: Left Arm, Patient Position: Sitting, Cuff Size: Large)   Pulse 67   Resp 16   Ht 5' 10 (1.778 m)   Wt 199 lb 9.6 oz (90.5 kg)   SpO2 95%   BMI 28.64 kg/m    Wt Readings from Last 3 Encounters:  01/17/24 199 lb 9.6 oz (90.5 kg)  01/16/23 192 lb (87.1 kg)  09/25/22 196 lb (88.9 kg)    Physical Exam Neck:     Vascular: No carotid bruit or JVD.   Cardiovascular:     Rate and Rhythm: Normal rate and regular rhythm.     Pulses: Intact distal pulses.     Heart sounds: Normal heart sounds. No murmur heard.    No gallop.  Pulmonary:     Effort: Pulmonary effort is normal.     Breath sounds: Normal breath sounds.  Abdominal:     General: Bowel sounds are normal.     Palpations: Abdomen is soft.   Musculoskeletal:     Right lower leg: No edema.     Left lower leg: No edema.  Studies Reviewed: .    Echocardiogram and stress test from February 2024 EKG:    EKG Interpretation Date/Time:  Friday January 17 2024 10:40:28 EDT Ventricular Rate:  82 PR Interval:  160 QRS Duration:  84 QT Interval:  388 QTC Calculation: 453 R Axis:   63  Text Interpretation: EKG 01/17/2024: Normal sinus rhythm at the rate of 82 bpm, PVCs (5).  No evidence of ischemia. Confirmed by Gurvir Schrom, Jagadeesh ([id number]) on 01/17/2024 11:05:35 AM  Compared to 01/17/2023, PACs new.  Medications ordered    Meds ordered this encounter  Medications   nitroGLYCERIN  (NITROSTAT ) 0.4 MG SL tablet    Sig: Place 1 tablet (0.4 mg total) under the tongue every 5  (five) minutes as needed.    Dispense:  25 tablet    Refill:  1     ASSESSMENT AND PLAN: .      ICD-10-CM   1. Coronary artery disease of native artery of native heart with stable angina pectoris (HCC)  I25.118 EKG 12-Lead    nitroGLYCERIN  (NITROSTAT ) 0.4 MG SL tablet    2. Dyspnea on exertion  R06.09     3. Essential hypertension  I10     4. Pure hypercholesterolemia  E78.00     5. PAC (premature atrial contraction)  I49.1      Assessment & Plan Coronary artery disease with stable angina Coronary artery disease with stable angina, managed with medication. Intermittent chest pain on exertion, no chest pain at rest. Recent stress test and echocardiogram showed normal heart function. Shortness of breath likely related to age and deconditioning. Stent placed in 2017 is functioning well and considered a permanent part of the heart anatomy. Discussed is very low risk stress test in February 2024 and also echocardiogram revealing normal LVEF. - Prescribe nitroglycerin  for angina management. - Encourage continued physical activity to maintain conditioning.  Type 2 diabetes mellitus Type 2 diabetes mellitus, previously poorly controlled, now improved with Ozempic . Hemoglobin A1c reduced from 8 to 6.5. Reports occasional consumption of sweets and bread. Current dose of Ozempic  is 1 mg, but has been on 2 mg previously. Benefits include improved heart health, diabetes control, and weight loss. - Continue Ozempic  for diabetes management. - Encourage communication with pharmacy to adjust Ozempic  dose to 2 mg if needed.  Hypertension Hypertension well controlled with current medication regimen. Blood pressure stable on losartan  12.5 mg daily. - Continue losartan  12.5 mg daily for blood pressure control.  Hypercholesterolemia external labs reviewed, lipids under excellent control, LDL is 45.  Presently on Crestor  40 mg daily, tolerating this without side effects, continue the same.  Office visit  in a year.  Signed,  Gordy Bergamo, MD, [facility] 01/18/2024, 5:04 PM [facility] [address] [place], [place] [address] Phone: [phone]. Fax:  [phone]

## 2024-02-14 ENCOUNTER — Other Ambulatory Visit: Payer: Self-pay | Admitting: Cardiology

## 2024-03-16 DIAGNOSIS — I2510 Atherosclerotic heart disease of native coronary artery without angina pectoris: Secondary | ICD-10-CM | POA: Diagnosis not present

## 2024-03-16 DIAGNOSIS — I10 Essential (primary) hypertension: Secondary | ICD-10-CM | POA: Diagnosis not present

## 2024-03-16 DIAGNOSIS — E039 Hypothyroidism, unspecified: Secondary | ICD-10-CM | POA: Diagnosis not present

## 2024-03-16 DIAGNOSIS — E1165 Type 2 diabetes mellitus with hyperglycemia: Secondary | ICD-10-CM | POA: Diagnosis not present

## 2024-03-20 DIAGNOSIS — S61412A Laceration without foreign body of left hand, initial encounter: Secondary | ICD-10-CM | POA: Diagnosis not present

## 2024-03-24 DIAGNOSIS — S61412A Laceration without foreign body of left hand, initial encounter: Secondary | ICD-10-CM | POA: Diagnosis not present

## 2024-03-24 DIAGNOSIS — M79642 Pain in left hand: Secondary | ICD-10-CM | POA: Diagnosis not present

## 2024-06-29 ENCOUNTER — Telehealth: Payer: Self-pay | Admitting: Cardiology

## 2024-06-29 ENCOUNTER — Ambulatory Visit: Attending: Cardiology | Admitting: Cardiology

## 2024-06-29 ENCOUNTER — Encounter: Payer: Self-pay | Admitting: Cardiology

## 2024-06-29 VITALS — BP 122/64 | HR 64 | Resp 16 | Ht 70.0 in | Wt 208.4 lb

## 2024-06-29 DIAGNOSIS — I25118 Atherosclerotic heart disease of native coronary artery with other forms of angina pectoris: Secondary | ICD-10-CM | POA: Diagnosis not present

## 2024-06-29 DIAGNOSIS — I10 Essential (primary) hypertension: Secondary | ICD-10-CM

## 2024-06-29 DIAGNOSIS — R42 Dizziness and giddiness: Secondary | ICD-10-CM

## 2024-06-29 DIAGNOSIS — R0609 Other forms of dyspnea: Secondary | ICD-10-CM

## 2024-06-29 NOTE — Progress Notes (Signed)
 Cardiology Office Note:  .   Date:  06/29/2024  ID:  John Shaffer, DOB 07-30-1946, MRN [id number] PCP: John Pagan, NP  [facility] Providers Cardiologist:  John Bergamo, MD   History of Present Illness: .   John Shaffer is a 78 y.o. with CAD, hyperlipidemia, OSA on CPAP, DM without complications and NSTEMI 01/17/2016, SP stenting to right coronary artery with a bare-metal stent in [place], [place].  He has had a low risk exercise nuclear stress test on 09/03/2022 with no ischemia, diaphragmatic attenuation with EF 41% but visually appeared normal, echocardiogram on 09/19/2022 revealing normal LVEF at 60 to 65%.   He made an appointment to see me due to dizziness and worsening dyspnea.  Patient has had chronic dyspnea on exertion.  Dizziness is mostly upon standing but is chronic.  States that he feels very unsteady on his gait.  He was wondering if low blood pressure contributed by losartan  is an issue.    Discussed the use of AI scribe software for clinical note transcription with the patient, who gave verbal consent to proceed.  History of Present Illness John Shaffer is a 78 year old male with coronary artery disease and diabetes who presents with dizziness and blood pressure fluctuations. He was referred by a doctor at the [facility] to consult with a cardiologist regarding his blood pressure and dizziness.  He has had dizziness for a couple of months, mainly when standing, especially when getting up at night. He describes feeling unfocused and unsteady. Home blood pressures fluctuate, with systolic readings as low as 60 to 65 mmHg four to five times per week, often at the time of dizziness. He checks his blood pressure daily or every other day.  He has coronary artery disease with prior non-ST elevation myocardial infarction treated with stent placement and has not seen a cardiologist since that procedure. He notes shortness of breath with walking and yard work, but not at rest. He  takes losartan  25 mg, half a tablet daily, and recently changed the dose from evening to morning.  He has diabetes and is concerned his dizziness could be related to either his diabetes or his blood pressure medication. He receives his medical care and medications through the [facility].  Cardiac Studies relevent.    MYOCARDIAL PERFUSION WO LEXISCAN  09/03/2022  Exercise nuclear stress test 09/03/2022: There is a fixed mild defect in the inferior region suggestive of diaphragmatic attenuation. NO ischemia. Overall LV systolic function is normal without regional wall motion abnormalities. Stress LV EF: 41%, visually appears normal. Normal ECG stress. The patient exercised for 6 minutes and 52 seconds of a Bruce protocol, achieving approximately 8.39 METs & 89% MPHR. The blood pressure response was normal. No significant change from 12/12/2020. Previously EF was calculated at >55%. Low risk.  Labs   Care everywhere/Faxed External Labs:  Labs 03/16/2024:  TSH normal at 3.6.  A1c 6.5%.  Hb 13.6/HCT 42.2, platelets 263.  Labs 01/08/2024:  Total cholesterol 106, triglycerides 87, HDL 44, LDL 45.  BUN 16, creatinine 1.02, eGFR 77 mL, potassium 4.9, LFTs normal.   ROS  Review of Systems  Cardiovascular:  Positive for dyspnea on exertion (chronic). Negative for chest pain, leg swelling, near-syncope and orthopnea.  Neurological:  Positive for disturbances in coordination and dizziness.   Physical Exam:   VS:  BP 122/64 (BP Location: Left Arm, Patient Position: Sitting, Cuff Size: Large)   Pulse 64   Resp 16   Ht 5' 10 (1.778  m)   Wt 208 lb 6.4 oz (94.5 kg)   SpO2 96%   BMI 29.90 kg/m    Wt Readings from Last 3 Encounters:  06/29/24 208 lb 6.4 oz (94.5 kg)  01/17/24 199 lb 9.6 oz (90.5 kg)  01/16/23 192 lb (87.1 kg)    BP Readings from Last 3 Encounters:  06/29/24 122/64  01/17/24 121/72  05/15/23 119/66   Orthostatic VS for the past 24 hrs (Last 3 readings):  BP- Lying Pulse- Lying  BP- Sitting Pulse- Sitting BP- Standing at 0 minutes Pulse- Standing at 0 minutes  06/29/24 1448 126/71 57 119/82 76 125/78 70    Physical Exam Constitutional:      Appearance: He is obese.  Neck:     Vascular: No carotid bruit or JVD.  Cardiovascular:     Rate and Rhythm: Normal rate and regular rhythm.     Pulses: Intact distal pulses.     Heart sounds: Normal heart sounds. No murmur heard.    No gallop.  Pulmonary:     Effort: Pulmonary effort is normal.     Breath sounds: Normal breath sounds.  Abdominal:     General: Bowel sounds are normal.     Palpations: Abdomen is soft.  Musculoskeletal:     Right lower leg: No edema.     Left lower leg: No edema.    EKG:         ASSESSMENT AND PLAN: .      ICD-10-CM   1. Dizzinesses  R42     2. Dyspnea on exertion  R06.09 ECHOCARDIOGRAM COMPLETE    3. Essential hypertension  I10     4. Coronary artery disease of native artery of native heart with stable angina pectoris  I25.118 ECHOCARDIOGRAM COMPLETE     Assessment & Plan Dizziness Primarily when standing, not explained by blood pressure fluctuations. Possible diabetic peripheral neuropathy or neurological issue. Not vertigo. -Consider referral to neurology for evaluation of dizziness - Consider stopping losartan  for 2-3 weeks to assess impact on dizziness - If dizziness persists despite normal blood pressure, consider neurological evaluation, restart losartan  at 12.5 mg in the evening for cardiovascular protection and renal protection in view of diabetes mellitus - If symptoms improve, he can discontinue low-dose of losartan  12.5 mg daily.  Dyspnea on exertion Intermittent dyspnea on exertion, possibly related to weight gain after stopping Ozempic . Previous stress test showed no blockages. Echocardiogram needed to assess heart function. - Ordered echocardiogram to assess heart function - Scheduled echocardiogram for January as per his schedule - Do not suspect  progression of coronary disease as he had negative nuclear stress test just a year ago and his risk factors are well-controlled  Coronary artery disease without angina Coronary artery disease with no angina. Previous stress test and cardiac exams show no significant changes. Heart function appears stable. - Continue current management and monitoring  Essential hypertension Blood pressure fluctuations with episodes of low blood pressure causing dizziness. Currently on losartan  25 mg half tablet. Advised to adjust timing of losartan  to nighttime to reduce morning hypotension. - Adjust losartan  administration to nighttime - Monitor blood pressure, especially systolic readings - If systolic blood pressure is consistently below 100, consider stopping losartan   Follow up: 1 year or sooner if problems. Signed,  John Bergamo, MD, [facility] 06/29/2024, 3:20 PM [facility] [address] [place], [place] [address] Phone: [phone]. Fax:  [phone]

## 2024-06-29 NOTE — Telephone Encounter (Signed)
 STAT if patient feels like he/she is going to faint   Are you dizzy, lightheaded, or faint now?  Was lightheaded earlier this morning when taking a walk. Still a little lightheaded right now.   Have you passed out? No, but patient says he lost balance and had a fall about 1.5 months ago.  Do you have any other symptoms? SOB coming and going for the past few months   Have you checked your HR and BP (record if available)?  BP fluctuates, low to normal.

## 2024-06-29 NOTE — Patient Instructions (Signed)
 Medication Instructions:  Your physician recommends that you continue on your current medications as directed. Please refer to the Current Medication list given to you today.  *If you need a refill on your cardiac medications before your next appointment, please call your pharmacy*  Lab Work: NONE If you have labs (blood work) drawn today and your tests are completely normal, you will receive your results only by: MyChart Message (if you have MyChart) OR A paper copy in the mail If you have any lab test that is abnormal or we need to change your treatment, we will call you to review the results.  Testing/Procedures: Echocardiogram in Jan  Follow-Up: At [facility], you and your health needs are our priority.  As part of our continuing mission to provide you with exceptional heart care, our providers are all part of one team.  This team includes your primary Cardiologist (physician) and Advanced Practice Providers or APPs (Physician Assistants and Nurse Practitioners) who all work together to provide you with the care you need, when you need it.  Your next appointment:   1 year(s)  Provider:   Gordy Bergamo, MD    We recommend signing up for the patient portal called MyChart.  Sign up information is provided on this After Visit Summary.  MyChart is used to connect with patients for Virtual Visits (Telemedicine).  Patients are able to view lab/test results, encounter notes, upcoming appointments, etc.  Non-urgent messages can be sent to your provider as well.   To learn more about what you can do with MyChart, go to [email or web address].

## 2024-06-29 NOTE — Telephone Encounter (Signed)
 Patient reports he has been experiencing lightheadedness when walking with shortness of breath. Denies any chest pain or loss of consciousness.  Patient also reports fluctuating blood pressures. BP today: 116/72. He states SBP can run as low as 60's-70's, and DBP as low as 40's.  He currently takes 12.5 mg of Losartan . [facility] Ladona to discuss. Patient requested appt before 12/12--opening for today at 2:40 PM, patient accepted and will contact [facility] to inform them of appointment.

## 2024-07-15 ENCOUNTER — Other Ambulatory Visit: Payer: Self-pay | Admitting: Cardiology

## 2024-08-10 ENCOUNTER — Ambulatory Visit
Admission: EM | Admit: 2024-08-10 | Discharge: 2024-08-10 | Disposition: A | Discharge status: Home / Self Care | Attending: Family Medicine | Admitting: Family Medicine

## 2024-08-10 DIAGNOSIS — E119 Type 2 diabetes mellitus without complications: Secondary | ICD-10-CM

## 2024-08-10 DIAGNOSIS — J209 Acute bronchitis, unspecified: Secondary | ICD-10-CM | POA: Diagnosis not present

## 2024-08-10 DIAGNOSIS — J449 Chronic obstructive pulmonary disease, unspecified: Secondary | ICD-10-CM

## 2024-08-10 DIAGNOSIS — Z7984 Long term (current) use of oral hypoglycemic drugs: Secondary | ICD-10-CM | POA: Diagnosis not present

## 2024-08-10 MED ORDER — AZITHROMYCIN 250 MG PO TABS: ORAL_TABLET | ORAL | 0 refills | Status: AC

## 2024-08-10 MED ORDER — PROMETHAZINE-DM 6.25-15 MG/5ML PO SYRP: 5.0000 mL | ORAL_SOLUTION | Freq: Three times a day (TID) | ORAL | 0 refills | Status: AC | PRN

## 2024-08-10 NOTE — ED Triage Notes (Signed)
 Pt c/o cough, head/chest congestion, runny nose sx x 4-5 days-taking mucinex d-NAD-steady gait

## 2024-08-10 NOTE — ED Provider Notes (Signed)
 " [facility]  Note:  This document was prepared using Conservation officer, historic buildings and may include unintentional dictation errors.  MRN: [id number] DOB: [birth year]  Subjective:   John Shaffer is a 79 y.o. male presenting for 4-5 day history of sinus congestion, runny nose, sinus drainage, productive cough, body aches from persistent cough especially his chest. Cough can elicit shortness of breath. Has COPD per patient. Has an inhaler and has been using it more often. No smoking of any kind including cigarettes, cigars, vaping, marijuana use.  Has an A1c of ~7% done in December 2025.   Current Outpatient Medications  Medication Instructions   alfuzosin (UROXATRAL) 10 mg, Daily   aspirin EC 81 mg, Daily   famotidine  (PEPCID ) 20 mg, 2 times daily PRN   levothyroxine (SYNTHROID) 25 mcg, 3 times weekly   losartan  (COZAAR ) 12.5 mg, Oral, Daily   METFORMIN HCL ER PO 1,000 mg, Daily   Multiple Vitamin (MULTIVITAMIN WITH MINERALS) TABS 1 tablet   nitroGLYCERIN  (NITROSTAT ) 0.4 mg, Sublingual, Every 5 min PRN   rosuvastatin  (CRESTOR ) 20 mg, Oral, Daily    Allergies[1]  Past Medical History:  Diagnosis Date   AAA (abdominal aortic aneurysm)    Atherosclerosis of native coronary artery of native heart with angina pectoris 01/23/2019   Diabetes mellitus without complication (HCC)    H/O exercise stress test    chets pain, atypical   H/O non-ST elevation myocardial infarction (NSTEMI) 01/23/2019   Hip pain, left    HLD (hyperlipidemia)    HTN (hypertension) 01/23/2019   Hyperlipemia 01/23/2019     Past Surgical History:  Procedure Laterality Date   SHOULDER SURGERY      Family History  Problem Relation Age of Onset   Lung disease Mother    Heart failure Father    Cancer Maternal Grandmother    Lung disease Maternal Grandfather    Cancer Paternal Grandfather     Social History   Occupational History   Not on file  Tobacco Use   Smoking status:  Former    Current packs/day: 0.00    Average packs/day: 0.5 packs/day for 10.0 years (5.0 ttl pk-yrs)    Types: Cigarettes    Start date: 39    Quit date: 1989    Years since quitting: 37.0   Smokeless tobacco: Never   Tobacco comments:    started smoking in 7th grade   Vaping Use   Vaping status: Never Used  Substance and Sexual Activity   Alcohol use: Not Currently   Drug use: No   Sexual activity: Not on file     ROS   Objective:   Vitals: BP 109/68 (BP Location: Left Arm)   Pulse 76   Temp 98.8 F (37.1 C) (Oral)   Resp 20   SpO2 93%   Physical Exam Constitutional:      General: He is not in acute distress.    Appearance: Normal appearance. He is well-developed and normal weight. He is not ill-appearing, toxic-appearing or diaphoretic.  HENT:     Head: Normocephalic and atraumatic.     Right Ear: External ear normal.     Left Ear: External ear normal.     Nose: Congestion present. No rhinorrhea.     Mouth/Throat:     Mouth: Mucous membranes are moist.     Pharynx: No pharyngeal swelling, oropharyngeal exudate, posterior oropharyngeal erythema or uvula swelling.     Tonsils: No tonsillar exudate or tonsillar abscesses. 0  on the right. 0 on the left.     Comments: Thick streaks of postnasal drainage overlying pharynx.  Eyes:     General: No scleral icterus.       Right eye: No discharge.        Left eye: No discharge.     Extraocular Movements: Extraocular movements intact.  Cardiovascular:     Rate and Rhythm: Normal rate and regular rhythm.     Heart sounds: Normal heart sounds. No murmur heard.    No friction rub. No gallop.  Pulmonary:     Effort: Pulmonary effort is normal. No respiratory distress.     Breath sounds: No stridor. Wheezing and rhonchi present. No rales.  Musculoskeletal:     Cervical back: Normal range of motion.  Neurological:     Mental Status: He is alert and oriented to person, place, and time.  Psychiatric:        Mood and  Affect: Mood normal.        Behavior: Behavior normal.        Thought Content: Thought content normal.        Judgment: Judgment normal.     Assessment and Plan :   PDMP not reviewed this encounter.  1. Acute bronchitis, unspecified organism   2. Chronic obstructive pulmonary disease, unspecified COPD type (HCC)   3. Type 2 diabetes mellitus treated without insulin (HCC)      Given more productive thick mucus in his sputum and respiratory symptoms will cover for acute bronchitis in the context of his COPD with azithromycin .  Deferred imaging for now.  Will avoid steroid use as patient has concerns about his blood sugars and admits that his diet is noncompliant.  Recommend supportive care otherwise.  Counseled patient on potential for adverse effects with medications prescribed/recommended today, ER and return-to-clinic precautions discussed, patient verbalized understanding.     [1]  Allergies Allergen Reactions   Bee Pollen Anaphylaxis    Other reaction(s): anaphylaxis   Bee Venom Anaphylaxis and Swelling    Wasp/ cause lips and feet to swell     Christopher Savannah, PA-C 08/10/24 1055  "

## 2024-08-10 NOTE — Discharge Instructions (Addendum)
 Start azithromycin  to help with your bronchitis, COPD. Use cough syrup as needed. Albuterol can help with wheezing.

## 2024-08-11 ENCOUNTER — Ambulatory Visit (HOSPITAL_COMMUNITY)
Admission: RE | Admit: 2024-08-11 | Discharge: 2024-08-11 | Disposition: A | Discharge status: Home / Self Care | Source: Ambulatory Visit | Attending: Cardiology | Admitting: Cardiology

## 2024-08-11 ENCOUNTER — Ambulatory Visit: Payer: Self-pay | Admitting: Cardiology

## 2024-08-11 DIAGNOSIS — R0609 Other forms of dyspnea: Secondary | ICD-10-CM | POA: Diagnosis present

## 2024-08-11 DIAGNOSIS — I25118 Atherosclerotic heart disease of native coronary artery with other forms of angina pectoris: Secondary | ICD-10-CM | POA: Insufficient documentation

## 2024-08-11 LAB — ECHOCARDIOGRAM COMPLETE
Area-P 1/2: 2.98 cm2
S' Lateral: 3.3 cm
